# Patient Record
Sex: Male | Born: 1960
Health system: Southern US, Community
[De-identification: ages and names within clinical notes are randomized; demographics above are authoritative.]

## PROBLEM LIST (undated history)

## (undated) DIAGNOSIS — M1990 Unspecified osteoarthritis, unspecified site: Secondary | ICD-10-CM

## (undated) DIAGNOSIS — K219 Gastro-esophageal reflux disease without esophagitis: Secondary | ICD-10-CM

## (undated) DIAGNOSIS — K449 Diaphragmatic hernia without obstruction or gangrene: Secondary | ICD-10-CM

## (undated) DIAGNOSIS — I10 Essential (primary) hypertension: Secondary | ICD-10-CM

## (undated) DIAGNOSIS — H269 Unspecified cataract: Secondary | ICD-10-CM

## (undated) HISTORY — DX: Essential (primary) hypertension: I10

## (undated) HISTORY — DX: Diaphragmatic hernia without obstruction or gangrene: K44.9

## (undated) HISTORY — DX: Unspecified osteoarthritis, unspecified site: M19.90

## (undated) HISTORY — PX: OTHER SURGICAL HISTORY: SHX169

## (undated) HISTORY — DX: Unspecified cataract: H26.9

---
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
## 2004-10-27 ENCOUNTER — Ambulatory Visit: Payer: Self-pay | Admitting: Internal Medicine

## 2004-10-29 ENCOUNTER — Ambulatory Visit: Payer: Self-pay | Admitting: Unknown Physician Specialty

## 2004-11-01 ENCOUNTER — Ambulatory Visit: Payer: Self-pay | Admitting: Internal Medicine

## 2004-11-03 ENCOUNTER — Ambulatory Visit: Payer: Self-pay | Admitting: Internal Medicine

## 2005-09-05 ENCOUNTER — Ambulatory Visit: Payer: Self-pay | Admitting: Unknown Physician Specialty

## 2009-01-28 ENCOUNTER — Ambulatory Visit: Payer: Self-pay | Admitting: Internal Medicine

## 2009-02-18 ENCOUNTER — Ambulatory Visit: Payer: Self-pay | Admitting: Internal Medicine

## 2009-02-27 ENCOUNTER — Ambulatory Visit: Payer: Self-pay | Admitting: Internal Medicine

## 2009-03-30 ENCOUNTER — Ambulatory Visit: Payer: Self-pay | Admitting: Internal Medicine

## 2009-09-07 ENCOUNTER — Ambulatory Visit: Payer: Self-pay | Admitting: Internal Medicine

## 2013-11-04 LAB — LIPID PANEL
CHOLESTEROL: 176 mg/dL (ref 0–200)
HDL: 22 mg/dL — AB (ref 35–70)
LDL CALC: 94 mg/dL
Triglycerides: 301 mg/dL — AB (ref 40–160)

## 2013-11-04 LAB — TSH: TSH: 1.4 u[IU]/mL (ref <=5.90)

## 2013-12-03 ENCOUNTER — Ambulatory Visit: Payer: Self-pay | Admitting: Family Medicine

## 2014-07-17 ENCOUNTER — Encounter: Payer: Self-pay | Admitting: *Deleted

## 2014-07-18 ENCOUNTER — Ambulatory Visit (INDEPENDENT_AMBULATORY_CARE_PROVIDER_SITE_OTHER): Payer: BLUE CROSS/BLUE SHIELD | Admitting: Cardiovascular Disease

## 2014-07-18 ENCOUNTER — Encounter: Payer: Self-pay | Admitting: Cardiovascular Disease

## 2014-07-18 VITALS — BP 158/100 | HR 66 | Ht 73.0 in | Wt 234.8 lb

## 2014-07-18 DIAGNOSIS — R9431 Abnormal electrocardiogram [ECG] [EKG]: Secondary | ICD-10-CM

## 2014-07-18 DIAGNOSIS — Z01810 Encounter for preprocedural cardiovascular examination: Secondary | ICD-10-CM

## 2014-07-18 DIAGNOSIS — I10 Essential (primary) hypertension: Secondary | ICD-10-CM

## 2014-07-18 LAB — HEMOGLOBIN A1C: Hgb A1c MFr Bld: 6.1 % — AB (ref 4.0–6.0)

## 2014-07-18 LAB — BASIC METABOLIC PANEL
BUN: 13 mg/dL (ref 4–21)
CREATININE: 1 mg/dL (ref <=1.3)
GLUCOSE: 128 mg/dL
Sodium: 5 mmol/L — AB (ref 137–147)

## 2014-07-18 NOTE — Patient Instructions (Signed)
You are at low risk for hip surgery from a cardiac standpoint.   Follow up as needed.

## 2014-07-18 NOTE — Assessment & Plan Note (Signed)
The patient has no previous cardiac history and has no cardiac symptoms. Physical exam is unremarkable with no cardiac murmurs. Functional capacity is reduced due to hip arthritis but overall is reasonable. Recent abnormal ECG was likely due to lead misplacement in V3. ECG from today is normal and there is no evidence of prior infarct. Thus, the patient is considered at an overall low risk from a cardiac standpoint for planned hip surgery. No ischemic evaluation is indicated at the present time.

## 2014-07-18 NOTE — Assessment & Plan Note (Signed)
Blood pressure is elevated. He was recently started on small dose amlodipine. If blood pressure remains elevated, the dose can be gradually increased.

## 2014-07-18 NOTE — Progress Notes (Signed)
Primary care physician: Dr. Venia Minks  HPI  This is a 54 year old male who was referred for preoperative cardiovascular evaluation before hip surgery. He has no previous cardiac history. He has known history of hypertension and previous tobacco use. He has no history of diabetes, chronic kidney disease or hyperlipidemia. He quit smoking in 1986. He drinks 2-3 beers a day. He has no family history of coronary artery disease. He has severe osteoarthritis affecting his right hip and needs to have hip replacement which is scheduled for next month. He was found to have slightly abnormal EKG recently. He denies any chest pain, shortness of breath, palpitations, orthopnea or lower extremity edema. His functional capacity is reduced due to hip arthritis. However, he is able to perform activities of daily living without cardiac symptoms. He was recently started on amlodipine for blood pressure control.  Allergies  Allergen Reactions  . Bee Venom      Current Outpatient Prescriptions on File Prior to Visit  Medication Sig Dispense Refill  . amLODipine (NORVASC) 2.5 MG tablet Take 2.5 mg by mouth daily.     No current facility-administered medications on file prior to visit.     Past Medical History  Diagnosis Date  . Hypertension   . Hernia, hiatal      Past Surgical History  Procedure Laterality Date  . Hernia repair    . Bicep surgery    . Elbow surgery       Family History  Problem Relation Age of Onset  . Family history unknown: Yes     History   Social History  . Marital Status: Married    Spouse Name: N/A  . Number of Children: N/A  . Years of Education: N/A   Occupational History  . Not on file.   Social History Main Topics  . Smoking status: Former Smoker    Types: Cigarettes  . Smokeless tobacco: Not on file  . Alcohol Use: Yes     Comment: daily  . Drug Use: No  . Sexual Activity: Not on file   Other Topics Concern  . Not on file   Social History  Narrative     ROS A 10 point review of system was performed. It is negative other than that mentioned in the history of present illness.   PHYSICAL EXAM   BP 158/100 mmHg  Pulse 66  Ht 6\' 1"  (1.854 m)  Wt 234 lb 12 oz (106.482 kg)  BMI 30.98 kg/m2 Constitutional: He is oriented to person, place, and time. He appears well-developed and well-nourished. No distress.  HENT: No nasal discharge.  Head: Normocephalic and atraumatic.  Eyes: Pupils are equal and round.  No discharge. Neck: Normal range of motion. Neck supple. No JVD present. No thyromegaly present.  Cardiovascular: Normal rate, regular rhythm, normal heart sounds. Exam reveals no gallop and no friction rub. No murmur heard.  Pulmonary/Chest: Effort normal and breath sounds normal. No stridor. No respiratory distress. He has no wheezes. He has no rales. He exhibits no tenderness.  Abdominal: Soft. Bowel sounds are normal. He exhibits no distension. There is no tenderness. There is no rebound and no guarding.  Musculoskeletal: Normal range of motion. He exhibits no edema and no tenderness.  Neurological: He is alert and oriented to person, place, and time. Coordination normal.  Skin: Skin is warm and dry. No rash noted. He is not diaphoretic. No erythema. No pallor.  Psychiatric: He has a normal mood and affect. His behavior is normal. Judgment  and thought content normal.       TDD:UKGUR  Rhythm  WITHIN NORMAL LIMITS   ASSESSMENT AND PLAN

## 2014-07-24 ENCOUNTER — Ambulatory Visit: Payer: Self-pay | Admitting: Physician Assistant

## 2014-07-24 NOTE — H&P (Signed)
TOTAL HIP ADMISSION H&P  Patient is admitted for right total hip arthroplasty.  Subjective:  Chief Complaint: right hip pain  HPI: Chad Matthews, 54 y.o. male, has a history of pain and functional disability in the right hip(s) due to arthritis and patient has failed non-surgical conservative treatments for greater than 12 weeks to include NSAID's and/or analgesics, corticosteriod injections, use of assistive devices and activity modification.  Onset of symptoms was gradual starting 3 years ago with gradually worsening course since that time.The patient noted no past surgery on the right hip(s).  Patient currently rates pain in the right hip at 8 out of 10 with activity. Patient has night pain, worsening of pain with activity and weight bearing, trendelenberg gait, pain that interfers with activities of daily living, pain with passive range of motion, crepitus and joint swelling. Patient has evidence of periarticular osteophytes and joint space narrowing by imaging studies. This condition presents safety issues increasing the risk of falls.There is no current active infection.  Patient Active Problem List   Diagnosis Date Noted  . Pre-operative cardiovascular examination 07/18/2014  . Essential hypertension 07/18/2014   Past Medical History  Diagnosis Date  . Hypertension   . Hernia, hiatal     Past Surgical History  Procedure Laterality Date  . Hernia repair    . Bicep surgery    . Elbow surgery       (Not in a hospital admission) Allergies  Allergen Reactions  . Bee Venom Anaphylaxis    History  Substance Use Topics  . Smoking status: Former Smoker    Types: Cigarettes  . Smokeless tobacco: Not on file  . Alcohol Use: Yes     Comment: daily    Family History  Problem Relation Age of Onset  . Family history unknown: Yes     Review of Systems  Constitutional: Negative.   HENT: Positive for hearing loss and tinnitus. Negative for congestion, ear discharge, ear pain,  nosebleeds and sore throat.   Eyes: Negative.   Respiratory: Negative.  Negative for stridor.   Cardiovascular: Negative.   Gastrointestinal: Negative.   Genitourinary: Negative.   Musculoskeletal: Positive for back pain and joint pain. Negative for falls.  Skin: Negative.   Neurological: Negative.  Negative for headaches.  Endo/Heme/Allergies: Negative.   Psychiatric/Behavioral: Negative.     Objective:  Physical Exam  Constitutional: He is oriented to person, place, and time. He appears well-developed and well-nourished. No distress.  HENT:  Head: Normocephalic and atraumatic.  Nose: Nose normal.  Eyes: Conjunctivae and EOM are normal. Pupils are equal, round, and reactive to light.  Neck: Normal range of motion. Neck supple.  Cardiovascular: Normal rate, regular rhythm, normal heart sounds and intact distal pulses.   Respiratory: Effort normal and breath sounds normal. No respiratory distress. He has no wheezes.  GI: Soft. Bowel sounds are normal. He exhibits no distension. There is no tenderness.  Musculoskeletal:       Right hip: He exhibits decreased range of motion and bony tenderness.  Lymphadenopathy:    He has no cervical adenopathy.  Neurological: He is alert and oriented to person, place, and time. No cranial nerve deficit.  Skin: Skin is warm and dry. No rash noted. No erythema.  Psychiatric: He has a normal mood and affect. His behavior is normal.    Vital signs in last 24 hours: @VSRANGES @  Labs:   Estimated body mass index is 30.98 kg/(m^2) as calculated from the following:   Height as of  07/18/14: 6\' 1"  (1.854 m).   Weight as of 07/18/14: 106.482 kg (234 lb 12 oz).   Imaging Review Plain radiographs demonstrate severe degenerative joint disease of the right hip(s). The bone quality appears to be good for age and reported activity level.  Assessment/Plan:  End stage arthritis, right hip(s)  The patient history, physical examination, clinical judgement  of the provider and imaging studies are consistent with end stage degenerative joint disease of the right hip(s) and total hip arthroplasty is deemed medically necessary. The treatment options including medical management, injection therapy, arthroscopy and arthroplasty were discussed at length. The risks and benefits of total hip arthroplasty were presented and reviewed. The risks due to aseptic loosening, infection, stiffness, dislocation/subluxation,  thromboembolic complications and other imponderables were discussed.  The patient acknowledged the explanation, agreed to proceed with the plan and consent was signed. Patient is being admitted for inpatient treatment for surgery, pain control, PT, OT, prophylactic antibiotics, VTE prophylaxis, progressive ambulation and ADL's and discharge planning.The patient is planning to be discharged home with home health services

## 2014-07-25 ENCOUNTER — Encounter (HOSPITAL_COMMUNITY): Payer: Self-pay

## 2014-07-25 ENCOUNTER — Encounter (HOSPITAL_COMMUNITY)
Admission: RE | Admit: 2014-07-25 | Discharge: 2014-07-25 | Disposition: A | Discharge status: Home / Self Care | Payer: BLUE CROSS/BLUE SHIELD | Source: Ambulatory Visit | Attending: Physician Assistant | Admitting: Physician Assistant

## 2014-07-25 ENCOUNTER — Encounter (HOSPITAL_COMMUNITY)
Admission: RE | Admit: 2014-07-25 | Discharge: 2014-07-25 | Disposition: A | Discharge status: Home / Self Care | Payer: BLUE CROSS/BLUE SHIELD | Source: Ambulatory Visit | Attending: Orthopedic Surgery | Admitting: Orthopedic Surgery

## 2014-07-25 DIAGNOSIS — M1611 Unilateral primary osteoarthritis, right hip: Secondary | ICD-10-CM | POA: Diagnosis not present

## 2014-07-25 DIAGNOSIS — Z01812 Encounter for preprocedural laboratory examination: Secondary | ICD-10-CM | POA: Insufficient documentation

## 2014-07-25 HISTORY — DX: Gastro-esophageal reflux disease without esophagitis: K21.9

## 2014-07-25 LAB — URINALYSIS, ROUTINE W REFLEX MICROSCOPIC
Bilirubin Urine: NEGATIVE
Glucose, UA: NEGATIVE mg/dL
HGB URINE DIPSTICK: NEGATIVE
Ketones, ur: NEGATIVE mg/dL
Leukocytes, UA: NEGATIVE
NITRITE: NEGATIVE
PH: 5.5 (ref 5.0–8.0)
Protein, ur: NEGATIVE mg/dL
SPECIFIC GRAVITY, URINE: 1.019 (ref 1.005–1.030)
UROBILINOGEN UA: 0.2 mg/dL (ref 0.0–1.0)

## 2014-07-25 LAB — COMPREHENSIVE METABOLIC PANEL
ALK PHOS: 96 U/L (ref 39–117)
ALT: 22 U/L (ref 0–53)
ANION GAP: 9 (ref 5–15)
AST: 26 U/L (ref 0–37)
Albumin: 4.8 g/dL (ref 3.5–5.2)
BILIRUBIN TOTAL: 1.5 mg/dL — AB (ref 0.3–1.2)
BUN: 12 mg/dL (ref 6–23)
CHLORIDE: 101 mmol/L (ref 96–112)
CO2: 26 mmol/L (ref 19–32)
Calcium: 9.3 mg/dL (ref 8.4–10.5)
Creatinine, Ser: 0.91 mg/dL (ref 0.50–1.35)
GFR calc Af Amer: >90 mL/min (ref >90)
GFR calc non Af Amer: >90 mL/min (ref >90)
Glucose, Bld: 137 mg/dL — ABNORMAL HIGH (ref 70–99)
POTASSIUM: 3.5 mmol/L (ref 3.5–5.1)
SODIUM: 136 mmol/L (ref 135–145)
TOTAL PROTEIN: 7.8 g/dL (ref 6.0–8.3)

## 2014-07-25 LAB — CBC WITH DIFFERENTIAL/PLATELET
BASOS ABS: 0 10*3/uL (ref 0.0–0.1)
Basophils Relative: 0 % (ref 0–1)
EOS ABS: 0.1 10*3/uL (ref 0.0–0.7)
EOS PCT: 2 % (ref 0–5)
HEMATOCRIT: 44.9 % (ref 39.0–52.0)
Hemoglobin: 15.8 g/dL (ref 13.0–17.0)
Lymphocytes Relative: 22 % (ref 12–46)
Lymphs Abs: 1.2 10*3/uL (ref 0.7–4.0)
MCH: 33.8 pg (ref 26.0–34.0)
MCHC: 35.2 g/dL (ref 30.0–36.0)
MCV: 95.9 fL (ref 78.0–100.0)
MONO ABS: 0.4 10*3/uL (ref 0.1–1.0)
MONOS PCT: 8 % (ref 3–12)
Neutro Abs: 3.6 10*3/uL (ref 1.7–7.7)
Neutrophils Relative %: 67 % (ref 43–77)
Platelets: 108 10*3/uL — ABNORMAL LOW (ref 150–400)
RBC: 4.68 MIL/uL (ref 4.22–5.81)
RDW: 12.1 % (ref 11.5–15.5)
WBC: 5.3 10*3/uL (ref 4.0–10.5)

## 2014-07-25 LAB — PROTIME-INR
INR: 1.11 (ref 0.00–1.49)
Prothrombin Time: 14.4 seconds (ref 11.6–15.2)

## 2014-07-25 LAB — SURGICAL PCR SCREEN
MRSA, PCR: NEGATIVE
Staphylococcus aureus: POSITIVE — AB

## 2014-07-25 LAB — APTT: APTT: 29 s (ref 24–37)

## 2014-07-25 NOTE — Pre-Procedure Instructions (Signed)
Chad Matthews  [birth year]   Your procedure is scheduled on:  March 11th, Friday   Report to [facility] Admitting at 5:30 AM.   Call this number if you have problems the morning of surgery: [phone]   Remember:   Do not eat food or drink liquids after midnight Thursday.   Take these medicines the morning of surgery with A SIP OF WATER: Amlodipine   Do not wear jewelry - no rings or watches.  Do not wear lotions or colognes.  You may NOT wear deodorant the day of surgery.   Men may shave face and neck.   Do not bring valuables to the hospital.  [facility] is not responsible for any belongings or valuables.               Contacts, dentures or bridgework may not be worn into surgery.  Leave suitcase in the car. After surgery it may be brought to your room.  For patients admitted to the hospital, discharge time is determined by your treatment team.               Name and phone number of your driver:    Special Instructions: "Preparing for Surgery" instruction sheet.   Please read over the following fact sheets that you were given: Pain Booklet, Coughing and Deep Breathing, Blood Transfusion Information, MRSA Information and Surgical Site Infection Prevention

## 2014-07-26 LAB — URINE CULTURE
COLONY COUNT: NO GROWTH
Culture: NO GROWTH

## 2014-08-07 MED ORDER — CEFAZOLIN SODIUM-DEXTROSE 2-3 GM-% IV SOLR
2.0000 g | INTRAVENOUS | Status: AC
  Administered 2014-08-08: 2 g via INTRAVENOUS
  Filled 2014-08-07: qty 50

## 2014-08-07 MED ORDER — TRANEXAMIC ACID 100 MG/ML IV SOLN
1000.0000 mg | INTRAVENOUS | Status: AC
  Administered 2014-08-08: 1000 mg via INTRAVENOUS
  Filled 2014-08-07: qty 10

## 2014-08-08 ENCOUNTER — Inpatient Hospital Stay (HOSPITAL_COMMUNITY): Payer: BLUE CROSS/BLUE SHIELD

## 2014-08-08 ENCOUNTER — Encounter (HOSPITAL_COMMUNITY)
Admission: RE | Disposition: A | Discharge status: Home Health | Payer: Self-pay | Source: Ambulatory Visit | Attending: Orthopedic Surgery

## 2014-08-08 ENCOUNTER — Encounter (HOSPITAL_COMMUNITY): Payer: Self-pay | Admitting: *Deleted

## 2014-08-08 ENCOUNTER — Inpatient Hospital Stay (HOSPITAL_COMMUNITY): Payer: BLUE CROSS/BLUE SHIELD | Admitting: Certified Registered Nurse Anesthetist

## 2014-08-08 ENCOUNTER — Inpatient Hospital Stay (HOSPITAL_COMMUNITY)
Admission: RE | Admit: 2014-08-08 | Discharge: 2014-08-10 | DRG: 470 | Disposition: A | Discharge status: Home Health | Payer: BLUE CROSS/BLUE SHIELD | Source: Ambulatory Visit | Attending: Orthopedic Surgery | Admitting: Orthopedic Surgery

## 2014-08-08 DIAGNOSIS — D6942 Congenital and hereditary thrombocytopenia purpura: Secondary | ICD-10-CM | POA: Diagnosis present

## 2014-08-08 DIAGNOSIS — Z79899 Other long term (current) drug therapy: Secondary | ICD-10-CM | POA: Diagnosis not present

## 2014-08-08 DIAGNOSIS — Z7901 Long term (current) use of anticoagulants: Secondary | ICD-10-CM

## 2014-08-08 DIAGNOSIS — K219 Gastro-esophageal reflux disease without esophagitis: Secondary | ICD-10-CM | POA: Diagnosis present

## 2014-08-08 DIAGNOSIS — Z87891 Personal history of nicotine dependence: Secondary | ICD-10-CM

## 2014-08-08 DIAGNOSIS — Z96641 Presence of right artificial hip joint: Secondary | ICD-10-CM

## 2014-08-08 DIAGNOSIS — I10 Essential (primary) hypertension: Secondary | ICD-10-CM | POA: Diagnosis present

## 2014-08-08 DIAGNOSIS — M1611 Unilateral primary osteoarthritis, right hip: Secondary | ICD-10-CM | POA: Diagnosis present

## 2014-08-08 DIAGNOSIS — D62 Acute posthemorrhagic anemia: Secondary | ICD-10-CM | POA: Diagnosis not present

## 2014-08-08 DIAGNOSIS — Z419 Encounter for procedure for purposes other than remedying health state, unspecified: Secondary | ICD-10-CM

## 2014-08-08 HISTORY — PX: TOTAL HIP ARTHROPLASTY: SHX124

## 2014-08-08 LAB — CREATININE, SERUM
CREATININE: 0.93 mg/dL (ref 0.50–1.35)
GFR calc non Af Amer: >90 mL/min (ref >90)

## 2014-08-08 LAB — ABO/RH: ABO/RH(D): A NEG

## 2014-08-08 LAB — CBC
HCT: 38.8 % — ABNORMAL LOW (ref 39.0–52.0)
HEMOGLOBIN: 13.2 g/dL (ref 13.0–17.0)
MCH: 32.8 pg (ref 26.0–34.0)
MCHC: 34 g/dL (ref 30.0–36.0)
MCV: 96.5 fL (ref 78.0–100.0)
Platelets: 95 10*3/uL — ABNORMAL LOW (ref 150–400)
RBC: 4.02 MIL/uL — ABNORMAL LOW (ref 4.22–5.81)
RDW: 12.2 % (ref 11.5–15.5)
WBC: 10.8 10*3/uL — ABNORMAL HIGH (ref 4.0–10.5)

## 2014-08-08 LAB — TYPE AND SCREEN
ABO/RH(D): A NEG
Antibody Screen: NEGATIVE

## 2014-08-08 SURGERY — ARTHROPLASTY, HIP, TOTAL,POSTERIOR APPROACH
Anesthesia: General | Site: Hip | Laterality: Right

## 2014-08-08 MED ORDER — VECURONIUM BROMIDE 10 MG IV SOLR
INTRAVENOUS | Status: AC
Start: 2014-08-08 — End: 2014-08-08
  Filled 2014-08-08: qty 10

## 2014-08-08 MED ORDER — ROCURONIUM BROMIDE 50 MG/5ML IV SOLN
INTRAVENOUS | Status: AC
  Filled 2014-08-08: qty 1

## 2014-08-08 MED ORDER — DOCUSATE SODIUM 100 MG PO CAPS
100.0000 mg | ORAL_CAPSULE | Freq: Two times a day (BID) | ORAL | Status: DC
  Administered 2014-08-08 – 2014-08-10 (×4): 100 mg via ORAL
  Filled 2014-08-08 (×4): qty 1

## 2014-08-08 MED ORDER — LIDOCAINE HCL (CARDIAC) 20 MG/ML IV SOLN
INTRAVENOUS | Status: AC
  Filled 2014-08-08: qty 15

## 2014-08-08 MED ORDER — LABETALOL HCL 5 MG/ML IV SOLN
INTRAVENOUS | Status: AC
  Filled 2014-08-08: qty 4

## 2014-08-08 MED ORDER — PROMETHAZINE HCL 25 MG/ML IJ SOLN
6.2500 mg | INTRAMUSCULAR | Status: DC | PRN
Start: 2014-08-08 — End: 2014-08-08

## 2014-08-08 MED ORDER — MIDAZOLAM HCL 2 MG/2ML IJ SOLN
INTRAMUSCULAR | Status: AC
  Filled 2014-08-08: qty 2

## 2014-08-08 MED ORDER — VECURONIUM BROMIDE 10 MG IV SOLR
INTRAVENOUS | Status: DC | PRN
  Administered 2014-08-08 (×2): 1 mg via INTRAVENOUS

## 2014-08-08 MED ORDER — ONDANSETRON HCL 4 MG/2ML IJ SOLN: 4.0000 mg | Freq: Four times a day (QID) | INTRAMUSCULAR | Status: DC | PRN

## 2014-08-08 MED ORDER — METHOCARBAMOL 500 MG PO TABS
500.0000 mg | ORAL_TABLET | Freq: Four times a day (QID) | ORAL | Status: DC | PRN
  Administered 2014-08-08 – 2014-08-09 (×3): 500 mg via ORAL
  Filled 2014-08-08 (×2): qty 1

## 2014-08-08 MED ORDER — LIDOCAINE HCL (CARDIAC) 20 MG/ML IV SOLN
INTRAVENOUS | Status: DC | PRN
  Administered 2014-08-08: 100 mg via INTRAVENOUS

## 2014-08-08 MED ORDER — DIPHENHYDRAMINE HCL 12.5 MG/5ML PO ELIX: 12.5000 mg | ORAL_SOLUTION | ORAL | Status: DC | PRN

## 2014-08-08 MED ORDER — PROPOFOL 10 MG/ML IV BOLUS
INTRAVENOUS | Status: DC | PRN
  Administered 2014-08-08: 150 mg via INTRAVENOUS
  Administered 2014-08-08: 20 mg via INTRAVENOUS

## 2014-08-08 MED ORDER — BISACODYL 10 MG RE SUPP: 10.0000 mg | Freq: Every day | RECTAL | Status: DC | PRN

## 2014-08-08 MED ORDER — NEOSTIGMINE METHYLSULFATE 10 MG/10ML IV SOLN
INTRAVENOUS | Status: AC
  Filled 2014-08-08: qty 4

## 2014-08-08 MED ORDER — PHENYLEPHRINE 40 MCG/ML (10ML) SYRINGE FOR IV PUSH (FOR BLOOD PRESSURE SUPPORT)
PREFILLED_SYRINGE | INTRAVENOUS | Status: AC
  Filled 2014-08-08: qty 10

## 2014-08-08 MED ORDER — ALUM & MAG HYDROXIDE-SIMETH 200-200-20 MG/5ML PO SUSP: 30.0000 mL | ORAL | Status: DC | PRN

## 2014-08-08 MED ORDER — SENNOSIDES-DOCUSATE SODIUM 8.6-50 MG PO TABS: 1.0000 | ORAL_TABLET | Freq: Every evening | ORAL | Status: DC | PRN

## 2014-08-08 MED ORDER — METOCLOPRAMIDE HCL 10 MG PO TABS: 5.0000 mg | ORAL_TABLET | Freq: Three times a day (TID) | ORAL | Status: DC | PRN

## 2014-08-08 MED ORDER — LACTATED RINGERS IV SOLN
INTRAVENOUS | Status: DC | PRN
  Administered 2014-08-08 (×2): via INTRAVENOUS

## 2014-08-08 MED ORDER — SODIUM CHLORIDE 0.9 % IV SOLN: INTRAVENOUS | Status: DC

## 2014-08-08 MED ORDER — MENTHOL 3 MG MT LOZG
1.0000 | LOZENGE | OROMUCOSAL | Status: DC | PRN
Start: 2014-08-08 — End: 2014-08-10

## 2014-08-08 MED ORDER — METHOCARBAMOL 1000 MG/10ML IJ SOLN
500.0000 mg | Freq: Four times a day (QID) | INTRAVENOUS | Status: DC | PRN
  Filled 2014-08-08: qty 5

## 2014-08-08 MED ORDER — ONDANSETRON HCL 4 MG/2ML IJ SOLN
INTRAMUSCULAR | Status: AC
  Filled 2014-08-08: qty 2

## 2014-08-08 MED ORDER — GLYCOPYRROLATE 0.2 MG/ML IJ SOLN
INTRAMUSCULAR | Status: AC
  Filled 2014-08-08: qty 2

## 2014-08-08 MED ORDER — SODIUM CHLORIDE 0.9 % IV SOLN
INTRAVENOUS | Status: DC
  Administered 2014-08-09: 04:00:00 via INTRAVENOUS

## 2014-08-08 MED ORDER — EPHEDRINE SULFATE 50 MG/ML IJ SOLN
INTRAMUSCULAR | Status: AC
  Filled 2014-08-08: qty 1

## 2014-08-08 MED ORDER — STERILE WATER FOR INJECTION IJ SOLN
INTRAMUSCULAR | Status: AC
  Filled 2014-08-08: qty 10

## 2014-08-08 MED ORDER — OXYCODONE HCL 5 MG PO TABS: 5.0000 mg | ORAL_TABLET | Freq: Once | ORAL | Status: DC | PRN

## 2014-08-08 MED ORDER — 0.9 % SODIUM CHLORIDE (POUR BTL) OPTIME
TOPICAL | Status: DC | PRN
  Administered 2014-08-08: 1000 mL

## 2014-08-08 MED ORDER — METHOCARBAMOL 500 MG PO TABS
ORAL_TABLET | ORAL | Status: AC
  Filled 2014-08-08: qty 1

## 2014-08-08 MED ORDER — METOCLOPRAMIDE HCL 5 MG/ML IJ SOLN: 5.0000 mg | Freq: Three times a day (TID) | INTRAMUSCULAR | Status: DC | PRN

## 2014-08-08 MED ORDER — PHENYLEPHRINE HCL 10 MG/ML IJ SOLN
INTRAMUSCULAR | Status: DC | PRN
  Administered 2014-08-08: 40 ug via INTRAVENOUS
  Administered 2014-08-08: 80 ug via INTRAVENOUS

## 2014-08-08 MED ORDER — ACETAMINOPHEN 650 MG RE SUPP: 650.0000 mg | Freq: Four times a day (QID) | RECTAL | Status: DC | PRN

## 2014-08-08 MED ORDER — ENOXAPARIN SODIUM 30 MG/0.3ML ~~LOC~~ SOLN: 30.0000 mg | Freq: Two times a day (BID) | SUBCUTANEOUS | Status: DC

## 2014-08-08 MED ORDER — OXYCODONE HCL 5 MG/5ML PO SOLN: 5.0000 mg | Freq: Once | ORAL | Status: DC | PRN

## 2014-08-08 MED ORDER — AMLODIPINE BESYLATE 2.5 MG PO TABS
2.5000 mg | ORAL_TABLET | Freq: Every day | ORAL | Status: DC
  Administered 2014-08-09 – 2014-08-10 (×2): 2.5 mg via ORAL
  Filled 2014-08-08 (×2): qty 1

## 2014-08-08 MED ORDER — DEXMEDETOMIDINE HCL IN NACL 200 MCG/50ML IV SOLN
INTRAVENOUS | Status: AC
Start: 2014-08-08 — End: 2014-08-08
  Filled 2014-08-08: qty 50

## 2014-08-08 MED ORDER — FENTANYL CITRATE 0.05 MG/ML IJ SOLN
INTRAMUSCULAR | Status: AC
  Filled 2014-08-08: qty 5

## 2014-08-08 MED ORDER — OXYCODONE HCL 5 MG PO TABS
5.0000 mg | ORAL_TABLET | ORAL | Status: DC | PRN
  Administered 2014-08-08 – 2014-08-09 (×4): 10 mg via ORAL
  Administered 2014-08-09 (×2): 5 mg via ORAL
  Administered 2014-08-09 – 2014-08-10 (×2): 10 mg via ORAL
  Filled 2014-08-08: qty 1
  Filled 2014-08-08 (×6): qty 2
  Filled 2014-08-08: qty 1

## 2014-08-08 MED ORDER — ROCURONIUM BROMIDE 100 MG/10ML IV SOLN
INTRAVENOUS | Status: DC | PRN
  Administered 2014-08-08: 40 mg via INTRAVENOUS
  Administered 2014-08-08: 10 mg via INTRAVENOUS

## 2014-08-08 MED ORDER — MIDAZOLAM HCL 5 MG/5ML IJ SOLN
INTRAMUSCULAR | Status: DC | PRN
  Administered 2014-08-08: 2 mg via INTRAVENOUS

## 2014-08-08 MED ORDER — HYDROMORPHONE HCL 1 MG/ML IJ SOLN
INTRAMUSCULAR | Status: AC
  Filled 2014-08-08: qty 1

## 2014-08-08 MED ORDER — NEOSTIGMINE METHYLSULFATE 10 MG/10ML IV SOLN
INTRAVENOUS | Status: DC | PRN
  Administered 2014-08-08: 3 mg via INTRAVENOUS

## 2014-08-08 MED ORDER — FENTANYL CITRATE 0.05 MG/ML IJ SOLN
INTRAMUSCULAR | Status: DC | PRN
  Administered 2014-08-08 (×2): 50 ug via INTRAVENOUS
  Administered 2014-08-08: 100 ug via INTRAVENOUS
  Administered 2014-08-08: 50 ug via INTRAVENOUS
  Administered 2014-08-08: 100 ug via INTRAVENOUS
  Administered 2014-08-08: 50 ug via INTRAVENOUS

## 2014-08-08 MED ORDER — BUPIVACAINE-EPINEPHRINE 0.5% -1:200000 IJ SOLN
INTRAMUSCULAR | Status: DC | PRN
  Administered 2014-08-08: 30 mL

## 2014-08-08 MED ORDER — PROPOFOL 10 MG/ML IV BOLUS
INTRAVENOUS | Status: AC
  Filled 2014-08-08: qty 20

## 2014-08-08 MED ORDER — CHLORHEXIDINE GLUCONATE 4 % EX LIQD
60.0000 mL | Freq: Once | CUTANEOUS | Status: DC
  Filled 2014-08-08: qty 60

## 2014-08-08 MED ORDER — ONDANSETRON HCL 4 MG/2ML IJ SOLN
INTRAMUSCULAR | Status: DC | PRN
  Administered 2014-08-08: 4 mg via INTRAVENOUS

## 2014-08-08 MED ORDER — ENOXAPARIN SODIUM 30 MG/0.3ML ~~LOC~~ SOLN
30.0000 mg | Freq: Two times a day (BID) | SUBCUTANEOUS | Status: DC
  Administered 2014-08-09 – 2014-08-10 (×3): 30 mg via SUBCUTANEOUS
  Filled 2014-08-08 (×3): qty 0.3

## 2014-08-08 MED ORDER — DEXAMETHASONE SODIUM PHOSPHATE 10 MG/ML IJ SOLN
10.0000 mg | Freq: Once | INTRAMUSCULAR | Status: AC
  Administered 2014-08-09: 10 mg via INTRAVENOUS
  Filled 2014-08-08: qty 1

## 2014-08-08 MED ORDER — CEFAZOLIN SODIUM-DEXTROSE 2-3 GM-% IV SOLR
2.0000 g | Freq: Four times a day (QID) | INTRAVENOUS | Status: AC
  Administered 2014-08-08 (×2): 2 g via INTRAVENOUS
  Filled 2014-08-08 (×2): qty 50

## 2014-08-08 MED ORDER — FAMOTIDINE 20 MG PO TABS
20.0000 mg | ORAL_TABLET | Freq: Every day | ORAL | Status: DC
  Administered 2014-08-08 – 2014-08-09 (×2): 20 mg via ORAL
  Filled 2014-08-08 (×2): qty 1

## 2014-08-08 MED ORDER — ONDANSETRON HCL 4 MG PO TABS: 4.0000 mg | ORAL_TABLET | Freq: Four times a day (QID) | ORAL | Status: DC | PRN

## 2014-08-08 MED ORDER — ACETAMINOPHEN 325 MG PO TABS: 650.0000 mg | ORAL_TABLET | Freq: Four times a day (QID) | ORAL | Status: DC | PRN

## 2014-08-08 MED ORDER — HYDROMORPHONE HCL 1 MG/ML IJ SOLN
0.2500 mg | INTRAMUSCULAR | Status: DC | PRN
  Administered 2014-08-08 (×2): 0.5 mg via INTRAVENOUS

## 2014-08-08 MED ORDER — SUCCINYLCHOLINE CHLORIDE 20 MG/ML IJ SOLN
INTRAMUSCULAR | Status: AC
  Filled 2014-08-08: qty 1

## 2014-08-08 MED ORDER — GLYCOPYRROLATE 0.2 MG/ML IJ SOLN
INTRAMUSCULAR | Status: DC | PRN
  Administered 2014-08-08: 0.4 mg via INTRAVENOUS

## 2014-08-08 MED ORDER — HYDROMORPHONE HCL 1 MG/ML IJ SOLN
1.0000 mg | INTRAMUSCULAR | Status: DC | PRN
  Administered 2014-08-08: 1 mg via INTRAVENOUS
  Filled 2014-08-08: qty 1

## 2014-08-08 MED ORDER — EPHEDRINE SULFATE 50 MG/ML IJ SOLN
INTRAMUSCULAR | Status: DC | PRN
  Administered 2014-08-08: 10 mg via INTRAVENOUS
  Administered 2014-08-08 (×2): 5 mg via INTRAVENOUS

## 2014-08-08 MED ORDER — METHOCARBAMOL 500 MG PO TABS: 500.0000 mg | ORAL_TABLET | Freq: Four times a day (QID) | ORAL | Status: DC

## 2014-08-08 MED ORDER — OXYCODONE HCL 5 MG PO TABS: 5.0000 mg | ORAL_TABLET | ORAL | Status: DC | PRN

## 2014-08-08 MED ORDER — PHENOL 1.4 % MT LIQD: 1.0000 | OROMUCOSAL | Status: DC | PRN

## 2014-08-08 MED ORDER — FLEET ENEMA 7-19 GM/118ML RE ENEM: 1.0000 | ENEMA | Freq: Once | RECTAL | Status: AC | PRN

## 2014-08-08 SURGICAL SUPPLY — 64 items
BLADE SAW SAG 73X25 THK (BLADE) ×2
BLADE SAW SGTL 73X25 THK (BLADE) ×1 IMPLANT
BRUSH FEMORAL CANAL (MISCELLANEOUS) IMPLANT
CAPT HIP TOTAL 2 ×3 IMPLANT
COVER SURGICAL LIGHT HANDLE (MISCELLANEOUS) ×3 IMPLANT
DRAPE IMP U-DRAPE 54X76 (DRAPES) ×3 IMPLANT
DRAPE INCISE IOBAN 66X45 STRL (DRAPES) ×3 IMPLANT
DRAPE ORTHO SPLIT 77X108 STRL (DRAPES) ×4
DRAPE SURG ORHT 6 SPLT 77X108 (DRAPES) ×2 IMPLANT
DRAPE U-SHAPE 47X51 STRL (DRAPES) ×3 IMPLANT
DRSG ADAPTIC 3X8 NADH LF (GAUZE/BANDAGES/DRESSINGS) ×3 IMPLANT
DRSG PAD ABDOMINAL 8X10 ST (GAUZE/BANDAGES/DRESSINGS) ×3 IMPLANT
DURAPREP 26ML APPLICATOR (WOUND CARE) ×3 IMPLANT
ELECT BLADE 6.5 EXT (BLADE) IMPLANT
ELECT CAUTERY BLADE 6.4 (BLADE) ×3 IMPLANT
ELECT REM PT RETURN 9FT ADLT (ELECTROSURGICAL) ×3
ELECTRODE REM PT RTRN 9FT ADLT (ELECTROSURGICAL) ×1 IMPLANT
EVACUATOR 1/8 PVC DRAIN (DRAIN) IMPLANT
FACESHIELD WRAPAROUND (MASK) ×9 IMPLANT
GAUZE SPONGE 4X4 12PLY STRL (GAUZE/BANDAGES/DRESSINGS) ×3 IMPLANT
GLOVE BIO SURGEON STRL SZ 6.5 (GLOVE) ×2 IMPLANT
GLOVE BIO SURGEON STRL SZ7.5 (GLOVE) ×9 IMPLANT
GLOVE BIO SURGEONS STRL SZ 6.5 (GLOVE) ×1
GLOVE BIOGEL PI IND STRL 6.5 (GLOVE) ×1 IMPLANT
GLOVE BIOGEL PI IND STRL 8 (GLOVE) ×2 IMPLANT
GLOVE BIOGEL PI INDICATOR 6.5 (GLOVE) ×2
GLOVE BIOGEL PI INDICATOR 8 (GLOVE) ×4
GLOVE ORTHO TXT STRL SZ7.5 (GLOVE) ×6 IMPLANT
GLOVE SURG ORTHO 8.0 STRL STRW (GLOVE) ×6 IMPLANT
GOWN STRL REUS W/ TWL LRG LVL3 (GOWN DISPOSABLE) ×3 IMPLANT
GOWN STRL REUS W/ TWL XL LVL3 (GOWN DISPOSABLE) ×1 IMPLANT
GOWN STRL REUS W/TWL 2XL LVL3 (GOWN DISPOSABLE) ×3 IMPLANT
GOWN STRL REUS W/TWL LRG LVL3 (GOWN DISPOSABLE) ×6
GOWN STRL REUS W/TWL XL LVL3 (GOWN DISPOSABLE) ×2
HANDPIECE INTERPULSE COAX TIP (DISPOSABLE)
HOOD PEEL AWAY FACE SHEILD DIS (HOOD) ×6 IMPLANT
IMMOBILIZER KNEE 22 (SOFTGOODS) ×3 IMPLANT
IMMOBILIZER KNEE 22 UNIV (SOFTGOODS) IMPLANT
KIT BASIN OR (CUSTOM PROCEDURE TRAY) ×3 IMPLANT
KIT ROOM TURNOVER OR (KITS) ×3 IMPLANT
MANIFOLD NEPTUNE II (INSTRUMENTS) ×3 IMPLANT
NEEDLE 22X1 1/2 (OR ONLY) (NEEDLE) ×3 IMPLANT
NEEDLE MAYO TROCAR (NEEDLE) ×3 IMPLANT
NS IRRIG 1000ML POUR BTL (IV SOLUTION) ×3 IMPLANT
PACK TOTAL JOINT (CUSTOM PROCEDURE TRAY) ×3 IMPLANT
PACK UNIVERSAL I (CUSTOM PROCEDURE TRAY) ×3 IMPLANT
PAD ARMBOARD 7.5X6 YLW CONV (MISCELLANEOUS) ×6 IMPLANT
PRESSURIZER FEMORAL UNIV (MISCELLANEOUS) IMPLANT
SET HNDPC FAN SPRY TIP SCT (DISPOSABLE) IMPLANT
STAPLER VISISTAT 35W (STAPLE) ×3 IMPLANT
SUCTION FRAZIER TIP 10 FR DISP (SUCTIONS) ×3 IMPLANT
SUT ETHIBOND NAB CT1 #1 30IN (SUTURE) ×6 IMPLANT
SUT VIC AB 0 CT1 27 (SUTURE) ×4
SUT VIC AB 0 CT1 27XBRD ANBCTR (SUTURE) ×2 IMPLANT
SUT VIC AB 1 CTB1 27 (SUTURE) ×6 IMPLANT
SUT VIC AB 2-0 CT1 27 (SUTURE) ×4
SUT VIC AB 2-0 CT1 TAPERPNT 27 (SUTURE) ×2 IMPLANT
SYR CONTROL 10ML LL (SYRINGE) ×3 IMPLANT
TAPE CLOTH SURG 4X10 WHT LF (GAUZE/BANDAGES/DRESSINGS) ×3 IMPLANT
TOWEL OR 17X24 6PK STRL BLUE (TOWEL DISPOSABLE) ×3 IMPLANT
TOWEL OR 17X26 10 PK STRL BLUE (TOWEL DISPOSABLE) ×3 IMPLANT
TOWER CARTRIDGE SMART MIX (DISPOSABLE) IMPLANT
TRAY FOLEY CATH 16FRSI W/METER (SET/KITS/TRAYS/PACK) IMPLANT
WATER STERILE IRR 1000ML POUR (IV SOLUTION) ×3 IMPLANT

## 2014-08-08 NOTE — Interval H&P Note (Signed)
History and Physical Interval Note:  08/08/2014 7:25 AM  Chad Matthews  has presented today for surgery, with the diagnosis of OA RIGHT HIP  The various methods of treatment have been discussed with the patient and family. After consideration of risks, benefits and other options for treatment, the patient has consented to  Procedure(s): RIGHT TOTAL HIP ARTHROPLASTY (Right) as a surgical intervention .  The patient's history has been reviewed, patient examined, no change in status, stable for surgery.  I have reviewed the patient's chart and labs.  Questions were answered to the patient's satisfaction.     Walter Min JR,W D

## 2014-08-08 NOTE — Evaluation (Signed)
Physical Therapy Evaluation Patient Details Name: Chad Matthews MRN: [id number] DOB: 01-19-1961 Today's Date: 08/08/2014   History of Present Illness  Patient is a 54 yo male admitted 08/08/14 now s/p Rt THA with posterior precautions.  PMH:  HTN, HOH, back pain, arthritis  Clinical Impression  Patient presents with problems listed below.  Will benefit from acute PT to maximize independence prior to discharge home with wife.  Should progress well with PT.    Follow Up Recommendations Home health PT;Supervision/Assistance - 24 hour    Equipment Recommendations  Rolling walker with 5" wheels;3in1 (PT)    Recommendations for Other Services       Precautions / Restrictions Precautions Precautions: Posterior Hip Precaution Booklet Issued: Yes (comment) Precaution Comments: Reviewed precautions with patient and wife Restrictions Weight Bearing Restrictions: Yes RLE Weight Bearing: Weight bearing as tolerated      Mobility  Bed Mobility Overal bed mobility: Needs Assistance Bed Mobility: Supine to Sit     Supine to sit: Min assist     General bed mobility comments: Verbal cues for technique to maintain hip precautions.  Assist to move RLE off of bed.  Patient able to use UE's to move trunk to sitting, and to scoot to EOB.    Transfers Overall transfer level: Needs assistance Equipment used: Rolling walker (2 wheeled) Transfers: Sit to/from Stand Sit to Stand: Min assist         General transfer comment: Verbal cues for hand placement and technique.  Assist to rise to standing and for balance initially.  Ambulation/Gait Ambulation/Gait assistance: Min assist Ambulation Distance (Feet): 30 Feet Assistive device: Rolling walker (2 wheeled) Gait Pattern/deviations: Step-to pattern;Decreased stance time - right;Decreased step length - left;Decreased stride length;Decreased weight shift to right;Antalgic Gait velocity: Decreased Gait velocity interpretation: Below  normal speed for age/gender General Gait Details: Verbal cues for safe use of RW and gait sequence.  Assist for balance/safety.  Patient ambulated to bathroom to void, and then ambulated to chair.  Stairs            Wheelchair Mobility    Modified Rankin (Stroke Patients Only)       Balance                                             Pertinent Vitals/Pain Pain Assessment: 0-10 Pain Score: 8  Pain Location: Rt hip Pain Descriptors / Indicators: Aching;Sore Pain Intervention(s): Limited activity within patient's tolerance;Repositioned;Patient requesting pain meds-RN notified    Home Living Family/patient expects to be discharged to:: Private residence Living Arrangements: Spouse/significant other Available Help at Discharge: Family;Available 24 hours/day Type of Home: House Home Access: Stairs to enter Entrance Stairs-Rails: Psychiatric nurse of Steps: 4 Home Layout: One level Home Equipment: None      Prior Function Level of Independence: Independent               Hand Dominance        Extremity/Trunk Assessment   Upper Extremity Assessment: Overall WFL for tasks assessed           Lower Extremity Assessment: RLE deficits/detail RLE Deficits / Details: Decreased strength/ROM post-op    Cervical / Trunk Assessment: Normal  Communication   Communication: HOH  Cognition Arousal/Alertness: Awake/alert Behavior During Therapy: WFL for tasks assessed/performed Overall Cognitive Status: Within Functional Limits for tasks assessed  General Comments      Exercises Total Joint Exercises Ankle Circles/Pumps: AROM;Both;10 reps;Supine      Assessment/Plan    PT Assessment Patient needs continued PT services  PT Diagnosis Difficulty walking;Acute pain   PT Problem List Decreased strength;Decreased range of motion;Decreased activity tolerance;Decreased balance;Decreased  mobility;Decreased knowledge of use of DME;Decreased knowledge of precautions;Pain  PT Treatment Interventions DME instruction;Gait training;Stair training;Functional mobility training;Therapeutic activities;Therapeutic exercise;Patient/family education   PT Goals (Current goals can be found in the Care Plan section) Acute Rehab PT Goals Patient Stated Goal: To go home PT Goal Formulation: With patient/family Time For Goal Achievement: 08/15/14 Potential to Achieve Goals: Good    Frequency 7X/week   Barriers to discharge        Co-evaluation               End of Session Equipment Utilized During Treatment: Gait belt Activity Tolerance: Patient tolerated treatment well;Patient limited by pain Patient left: in chair;with call bell/phone within reach;with family/visitor present;with nursing/sitter in room Nurse Communication: Mobility status;Patient requests pain meds         Time: 0488-8916 PT Time Calculation (min) (ACUTE ONLY): 23 min   Charges:   PT Evaluation $Initial PT Evaluation Tier I: 1 Procedure PT Treatments $Gait Training: 8-22 mins   PT G Codes:        Despina Pole 08-26-2014, 5:08 PM Carita Pian. Sanjuana Kava, [place] Pager [phone]

## 2014-08-08 NOTE — Brief Op Note (Signed)
08/08/2014  10:10 AM  PATIENT:  Chad Matthews  54 y.o. male  PRE-OPERATIVE DIAGNOSIS:  OA RIGHT HIP  POST-OPERATIVE DIAGNOSIS:  OA RIGHT HIP  PROCEDURE:  Procedure(s): RIGHT TOTAL HIP ARTHROPLASTY (Right)  SURGEON:  Surgeon(s) and Role:    * Earlie Server, MD - Primary  PHYSICIAN ASSISTANT: Chriss Czar, PA-C  ASSISTANTS:   ANESTHESIA:   local and general  EBL:  Total I/O In: 1400 [I.V.:1400] Out: 750 [Blood:750]  BLOOD ADMINISTERED:none  DRAINS: none   LOCAL MEDICATIONS USED:  MARCAINE     SPECIMEN:  No Specimen  DISPOSITION OF SPECIMEN:  N/A  COUNTS:  YES  TOURNIQUET:  * No tourniquets in log *  DICTATION: .Other Dictation: Dictation Number unknown  PLAN OF CARE: Admit to inpatient   PATIENT DISPOSITION:  PACU - hemodynamically stable.   Delay start of Pharmacological VTE agent (>24hrs) due to surgical blood loss or risk of bleeding: yes

## 2014-08-08 NOTE — Progress Notes (Signed)
Utilization Review Completed.Donne Anon T3/03/2015

## 2014-08-08 NOTE — Anesthesia Postprocedure Evaluation (Signed)
  Anesthesia Post-op Note  Patient: Chad Matthews  Procedure(s) Performed: Procedure(s): RIGHT TOTAL HIP ARTHROPLASTY (Right)  Patient Location: PACU  Anesthesia Type:General  Level of Consciousness: awake and alert   Airway and Oxygen Therapy: Patient Spontanous Breathing  Post-op Pain: mild  Post-op Assessment: Post-op Vital signs reviewed  Post-op Vital Signs: Reviewed  Last Vitals:  Filed Vitals:   08/08/14 1056  BP:   Pulse: 61  Temp: 36.9 C  Resp: 12    Complications: No apparent anesthesia complications

## 2014-08-08 NOTE — Transfer of Care (Signed)
Immediate Anesthesia Transfer of Care Note  Patient: Chad Matthews  Procedure(s) Performed: Procedure(s): RIGHT TOTAL HIP ARTHROPLASTY (Right)  Patient Location: PACU  Anesthesia Type:General  Level of Consciousness: awake, alert  and oriented  Airway & Oxygen Therapy: Patient Spontanous Breathing and Patient connected to nasal cannula oxygen  Post-op Assessment: Report given to RN, Post -op Vital signs reviewed and stable and Patient moving all extremities X 4  Post vital signs: Reviewed and stable  Last Vitals:  Filed Vitals:   08/08/14 0608  BP: 176/105  Pulse: 81  Temp: 36.8 C  Resp: 16    Complications: No apparent anesthesia complications

## 2014-08-08 NOTE — Anesthesia Procedure Notes (Signed)
Procedure Name: Intubation Date/Time: 08/08/2014 7:38 AM Performed by: Garrison Columbus T Pre-anesthesia Checklist: Patient identified, Emergency Drugs available, Suction available and Patient being monitored Patient Re-evaluated:Patient Re-evaluated prior to inductionOxygen Delivery Method: Circle system utilized Preoxygenation: Pre-oxygenation with 100% oxygen Intubation Type: IV induction Ventilation: Mask ventilation without difficulty and Oral airway inserted - appropriate to patient size Laryngoscope Size: Sabra Heck and 2 Grade View: Grade I Tube type: Oral Tube size: 7.5 mm Number of attempts: 1 Airway Equipment and Method: Stylet and Oral airway Placement Confirmation: ETT inserted through vocal cords under direct vision,  positive ETCO2 and breath sounds checked- equal and bilateral Secured at: 22 cm Tube secured with: Tape Dental Injury: Teeth and Oropharynx as per pre-operative assessment

## 2014-08-08 NOTE — H&P (View-Only) (Signed)
Primary care physician: Dr. Venia Minks  HPI  This is a 54 year old male who was referred for preoperative cardiovascular evaluation before hip surgery. He has no previous cardiac history. He has known history of hypertension and previous tobacco use. He has no history of diabetes, chronic kidney disease or hyperlipidemia. He quit smoking in 1986. He drinks 2-3 beers a day. He has no family history of coronary artery disease. He has severe osteoarthritis affecting his right hip and needs to have hip replacement which is scheduled for next month. He was found to have slightly abnormal EKG recently. He denies any chest pain, shortness of breath, palpitations, orthopnea or lower extremity edema. His functional capacity is reduced due to hip arthritis. However, he is able to perform activities of daily living without cardiac symptoms. He was recently started on amlodipine for blood pressure control.  Allergies  Allergen Reactions  . Bee Venom      Current Outpatient Prescriptions on File Prior to Visit  Medication Sig Dispense Refill  . amLODipine (NORVASC) 2.5 MG tablet Take 2.5 mg by mouth daily.     No current facility-administered medications on file prior to visit.     Past Medical History  Diagnosis Date  . Hypertension   . Hernia, hiatal      Past Surgical History  Procedure Laterality Date  . Hernia repair    . Bicep surgery    . Elbow surgery       Family History  Problem Relation Age of Onset  . Family history unknown: Yes     History   Social History  . Marital Status: Married    Spouse Name: N/A  . Number of Children: N/A  . Years of Education: N/A   Occupational History  . Not on file.   Social History Main Topics  . Smoking status: Former Smoker    Types: Cigarettes  . Smokeless tobacco: Not on file  . Alcohol Use: Yes     Comment: daily  . Drug Use: No  . Sexual Activity: Not on file   Other Topics Concern  . Not on file   Social History  Narrative     ROS A 10 point review of system was performed. It is negative other than that mentioned in the history of present illness.   PHYSICAL EXAM   BP 158/100 mmHg  Pulse 66  Ht 6\' 1"  (1.854 m)  Wt 234 lb 12 oz (106.482 kg)  BMI 30.98 kg/m2 Constitutional: He is oriented to person, place, and time. He appears well-developed and well-nourished. No distress.  HENT: No nasal discharge.  Head: Normocephalic and atraumatic.  Eyes: Pupils are equal and round.  No discharge. Neck: Normal range of motion. Neck supple. No JVD present. No thyromegaly present.  Cardiovascular: Normal rate, regular rhythm, normal heart sounds. Exam reveals no gallop and no friction rub. No murmur heard.  Pulmonary/Chest: Effort normal and breath sounds normal. No stridor. No respiratory distress. He has no wheezes. He has no rales. He exhibits no tenderness.  Abdominal: Soft. Bowel sounds are normal. He exhibits no distension. There is no tenderness. There is no rebound and no guarding.  Musculoskeletal: Normal range of motion. He exhibits no edema and no tenderness.  Neurological: He is alert and oriented to person, place, and time. Coordination normal.  Skin: Skin is warm and dry. No rash noted. He is not diaphoretic. No erythema. No pallor.  Psychiatric: He has a normal mood and affect. His behavior is normal. Judgment  and thought content normal.       FBP:ZWCHE  Rhythm  WITHIN NORMAL LIMITS   ASSESSMENT AND PLAN

## 2014-08-08 NOTE — Discharge Instructions (Signed)
Diet: As you were doing prior to hospitalization   Activity: Increase activity slowly as tolerated  No lifting or driving for 6 weeks   Shower: May shower without a dressing on post op day #5, NO SOAKING in tub   Dressing: You may change your dressing on post op day #3.  Then change the dressing daily with sterile 4"x4"s gauze dressing  And TED hose for knees. Use paper tape to hold dressing in place  For hips. You may clean the incision with alcohol prior to redressing.   Weight Bearing: weight bearing as taught in physical therapy. Use a walker or  Crutches as instructed.   To prevent constipation: you may use a stool softener such as -  Colace ( over the counter) 100 mg by mouth twice a day  Drink plenty of fluids ( prune juice may be helpful) and high fiber foods  Miralax ( over the counter) for constipation as needed.   Precautions: If you experience chest pain or shortness of breath - call 911 immediately For transfer to the hospital emergency department!!  If you develop a fever greater that 101 F, purulent drainage from wound, increased redness or drainage from wound, or calf pain -- Call the office   Follow- Up Appointment: Please call for an appointment to be seen in 2 weeks  [place] - [phone]

## 2014-08-08 NOTE — Anesthesia Preprocedure Evaluation (Addendum)
Anesthesia Evaluation  Patient identified by MRN, date of birth, ID band Patient awake    Reviewed: Allergy & Precautions, NPO status , Patient's Chart, lab work & pertinent test results  Airway Mallampati: II  TM Distance: >3 FB Neck ROM: Full    Dental  (+) Dental Advisory Given, Teeth Intact   Pulmonary former smoker,  breath sounds clear to auscultation        Cardiovascular hypertension, Pt. on medications Rhythm:Regular Rate:Normal     Neuro/Psych negative neurological ROS     GI/Hepatic Neg liver ROS, hiatal hernia, GERD-  Medicated,  Endo/Other  negative endocrine ROS  Renal/GU negative Renal ROS     Musculoskeletal negative musculoskeletal ROS (+)   Abdominal   Peds  Hematology negative hematology ROS (+)   Anesthesia Other Findings   Reproductive/Obstetrics                          Anesthesia Physical Anesthesia Plan  ASA: II  Anesthesia Plan: General   Post-op Pain Management:    Induction: Intravenous  Airway Management Planned: Oral ETT  Additional Equipment:   Intra-op Plan:   Post-operative Plan: Extubation in OR  Informed Consent: I have reviewed the patients History and Physical, chart, labs and discussed the procedure including the risks, benefits and alternatives for the proposed anesthesia with the patient or authorized representative who has indicated his/her understanding and acceptance.   Dental advisory given  Plan Discussed with: CRNA, Anesthesiologist and Surgeon  Anesthesia Plan Comments:        Anesthesia Quick Evaluation

## 2014-08-09 LAB — CBC
HCT: 34.5 % — ABNORMAL LOW (ref 39.0–52.0)
Hemoglobin: 11.8 g/dL — ABNORMAL LOW (ref 13.0–17.0)
MCH: 33.1 pg (ref 26.0–34.0)
MCHC: 34.2 g/dL (ref 30.0–36.0)
MCV: 96.9 fL (ref 78.0–100.0)
PLATELETS: 87 10*3/uL — AB (ref 150–400)
RBC: 3.56 MIL/uL — ABNORMAL LOW (ref 4.22–5.81)
RDW: 12.4 % (ref 11.5–15.5)
WBC: 9.5 10*3/uL (ref 4.0–10.5)

## 2014-08-09 LAB — BASIC METABOLIC PANEL
ANION GAP: 9 (ref 5–15)
BUN: 15 mg/dL (ref 6–23)
CO2: 26 mmol/L (ref 19–32)
Calcium: 8.7 mg/dL (ref 8.4–10.5)
Chloride: 99 mmol/L (ref 96–112)
Creatinine, Ser: 1.02 mg/dL (ref 0.50–1.35)
GFR calc Af Amer: >90 mL/min (ref >90)
GFR calc non Af Amer: 81 mL/min — ABNORMAL LOW (ref >90)
GLUCOSE: 166 mg/dL — AB (ref 70–99)
Potassium: 4.2 mmol/L (ref 3.5–5.1)
Sodium: 134 mmol/L — ABNORMAL LOW (ref 135–145)

## 2014-08-09 NOTE — Care Management Note (Signed)
CARE MANAGEMENT NOTE 02/12/9449  Patient:  Chad Matthews, COMMON   Account Number:  [id number]  Date Initiated:  08/08/2014  Documentation initiated by:  [facility]  Subjective/Objective Assessment:   s/p rt THA     Action/Plan:   PT is recommending RW.   Anticipated DC Date:  08/10/2014   Anticipated DC Plan:  [place]  CM consult      [facility] Choice  DURABLE MEDICAL EQUIPMENT   Choice offered to / List presented to:  C-1 Patient   DME arranged  3-N-1  Vassie Moselle      DME agency  [place] arranged  [place]   Status of service:  In process, will continue to follow Medicare Important Message given?   (If response is "NO", the following Medicare IM given date fields will be blank) Date Medicare IM given:   Medicare IM given by:   Date Additional Medicare IM given:   Additional Medicare IM given by:    Discharge Disposition:    Per UR Regulation:    If discussed at Long Length of Stay Meetings, dates discussed:    Comments:  08/09/14 [place], RN, BSN  Met with pt and wife. Discharge plan is to return home with the support of his wife. They don't have a preference for a [place] agency for DME. They agreed to use [facility] for DME. Contacted James at [facility] for referral.     08/08/14 Set up with [facility] for HHPT by MD office.

## 2014-08-09 NOTE — Progress Notes (Signed)
Physical Therapy Treatment Patient Details Name: Chad Matthews MRN: [id number] DOB: March 27, 1961 Today's Date: 08/09/2014    History of Present Illness Patient is a 54 yo male admitted 08/08/14 now s/p Rt THA with posterior precautions.  PMH:  HTN, HOH, back pain, arthritis    PT Comments    Patient sweaty during tx which he attributes to medication.  He is progressing nicely towards safe discharge home.  Will re-assess stairs Sunday to assure he is ready for discharge home with his wife. Cont to be fall risk secondary to decr bil step length and decr ability to fully WB on Rt leg.  Follow Up Recommendations  Home health PT;Supervision/Assistance - 24 hour     Equipment Recommendations  Rolling walker with 5" wheels;3in1 (PT)    Recommendations for Other Services       Precautions / Restrictions Precautions Precautions: Posterior Hip Precaution Booklet Issued: No Precaution Comments: reviewed hip precautions-pt able to verbalize 3/3  Restrictions Weight Bearing Restrictions: Yes RLE Weight Bearing: Weight bearing as tolerated    Mobility  Bed Mobility Overal bed mobility: Needs Assistance Bed Mobility: Sit to Supine       Sit to supine: Min assist (leg management)   General bed mobility comments: OOB upon entering room  Transfers Overall transfer level: Needs assistance Equipment used: Rolling walker (2 wheeled) Transfers: Sit to/from Stand Sit to Stand: Min assist (from chair with min cues for hand placement)         General transfer comment: cues for Rt leg management  Ambulation/Gait Ambulation/Gait assistance: Min assist Ambulation Distance (Feet): 80 Feet Assistive device: Rolling walker (2 wheeled) Gait Pattern/deviations: Shuffle;Decreased stance time - right Gait velocity: Decreased   General Gait Details: min cues to inc bil step length   Stairs Stairs: Yes Stairs assistance: Min guard Stair Management: One rail Right;Sideways;Step to  pattern Number of Stairs: 4 General stair comments: mod cues for sequencing; demo to patient/wife  Wheelchair Mobility    Modified Rankin (Stroke Patients Only)       Balance Overall balance assessment: Needs assistance Sitting-balance support: No upper extremity supported;Feet supported Sitting balance-Leahy Scale: Good     Standing balance support: Bilateral upper extremity supported Standing balance-Leahy Scale: Poor Standing balance comment: during static task                    Cognition Arousal/Alertness: Awake/alert Behavior During Therapy: WFL for tasks assessed/performed Overall Cognitive Status: Within Functional Limits for tasks assessed                      Exercises Total Joint Exercises Ankle Circles/Pumps: AROM;Both;10 reps;Seated Quad Sets: AROM;Both;10 reps;Seated Towel Squeeze: AROM;Both;10 reps;Seated Short Arc Quad: Right;10 reps;Seated;AAROM Heel Slides: AAROM;Right;10 reps;Seated Hip ABduction/ADduction: AAROM;Right;10 reps;Seated Straight Leg Raises: AAROM;10 reps;Seated Long Arc Quad: AAROM;Right;10 reps;Seated Knee Flexion: AROM;Both;10 reps;Standing (at [facility]) Marching in Standing: AROM;Right;10 reps;Standing (with RW) Standing Hip Extension: AROM;Both;10 reps;Standing (with RW)    General Comments        Pertinent Vitals/Pain Pain Assessment: 0-10 Pain Score: 5  Pain Location: Rt hip Pain Descriptors / Indicators: Sore Pain Intervention(s): Limited activity within patient's tolerance;Monitored during session    Home Living                      Prior Function            PT Goals (current goals can now be found in the care plan section) Acute Rehab  PT Goals Patient Stated Goal: be able to walk and return to work PT Goal Formulation: With patient/family Time For Goal Achievement: 08/15/14 Potential to Achieve Goals: Good Progress towards PT goals: Progressing toward goals    Frequency  7X/week    PT  Plan Current plan remains appropriate    Co-evaluation             End of Session Equipment Utilized During Treatment: Gait belt Activity Tolerance: Patient tolerated treatment well Patient left: in chair;with call bell/phone within reach;with family/visitor present     Time: 6295-2841 PT Time Calculation (min) (ACUTE ONLY): 26 min  Charges:  $Gait Training: 8-22 mins $Therapeutic Exercise: 8-22 mins                    G CodesMalka So, [place] [phone] [place] 08/09/2014, 4:34 PM

## 2014-08-09 NOTE — Progress Notes (Signed)
Physical Therapy Treatment Patient Details Name: Chad Matthews MRN: [id number] DOB: Apr 09, 1961 Today's Date: 08/09/2014    History of Present Illness Patient is a 54 yo male admitted 08/08/14 now s/p Rt THA with posterior precautions.  PMH:  HTN, HOH, back pain, arthritis    PT Comments    Patient making steady gains in PT.  Family present to observe his mobility needs.  Patient may benefit from cont skilled PT on this venue of care to progress mobility to ensure safe discharge home. Patient seen BID.  Follow Up Recommendations  Home health PT;Supervision/Assistance - 24 hour     Equipment Recommendations  Rolling walker with 5" wheels;3in1 (PT)    Recommendations for Other Services       Precautions / Restrictions Precautions Precautions: Posterior Hip Precaution Booklet Issued: No Precaution Comments: reviewed hip precautions-pt able to verbalize 3/3  Restrictions Weight Bearing Restrictions: Yes RLE Weight Bearing: Weight bearing as tolerated    Mobility  Bed Mobility               General bed mobility comments: OOB upon entering room  Transfers Overall transfer level: Needs assistance Equipment used: Rolling walker (2 wheeled) Transfers: Sit to/from Stand Sit to Stand: Min assist (from chair with min cues for hand placement)         General transfer comment: cues for Rt leg management  Ambulation/Gait Ambulation/Gait assistance: Min assist Ambulation Distance (Feet): 80 Feet Assistive device: Rolling walker (2 wheeled) Gait Pattern/deviations: Shuffle;Decreased stance time - right Gait velocity: Decreased   General Gait Details: min cues to inc bil step length   Stairs            Wheelchair Mobility    Modified Rankin (Stroke Patients Only)       Balance Overall balance assessment: Needs assistance Sitting-balance support: No upper extremity supported;Feet supported Sitting balance-Leahy Scale: Good     Standing balance  support: Bilateral upper extremity supported Standing balance-Leahy Scale: Poor                      Cognition Arousal/Alertness: Awake/alert Behavior During Therapy: WFL for tasks assessed/performed Overall Cognitive Status: Within Functional Limits for tasks assessed                      Exercises Total Joint Exercises Ankle Circles/Pumps: AROM;Both;10 reps;Seated Quad Sets: AROM;Both;10 reps;Seated Towel Squeeze: AROM;Both;10 reps;Seated Short Arc Quad: Right;10 reps;Seated;AAROM Heel Slides: AAROM;Right;10 reps;Seated Hip ABduction/ADduction: AAROM;Right;10 reps;Seated Straight Leg Raises: AAROM;10 reps;Seated Long Arc Quad: AAROM;Right;10 reps;Seated    General Comments        Pertinent Vitals/Pain Pain Assessment: 0-10 Pain Score: 5  Pain Location: Rt hip Pain Descriptors / Indicators: Sore Pain Intervention(s): Limited activity within patient's tolerance;Monitored during session    Home Living                      Prior Function            PT Goals (current goals can now be found in the care plan section) Acute Rehab PT Goals Patient Stated Goal: be able to walk and return to work PT Goal Formulation: With patient/family Time For Goal Achievement: 08/15/14 Potential to Achieve Goals: Good Progress towards PT goals: Progressing toward goals    Frequency  7X/week    PT Plan Current plan remains appropriate    Co-evaluation             End of  Session Equipment Utilized During Treatment: Gait belt Activity Tolerance: Patient tolerated treatment well Patient left: in chair;with call bell/phone within reach;with family/visitor present     Time: 8242-3536 PT Time Calculation (min) (ACUTE ONLY): 26 min  Charges:  $Gait Training: 8-22 mins $Therapeutic Exercise: 8-22 mins                    G CodesMalka So, [place] [phone] [place] 08/09/2014, 4:20 PM

## 2014-08-09 NOTE — Progress Notes (Signed)
Orthopaedic Trauma Service Progress Note  Subjective  Doing great overall No specific complaints other than some soreness in R hip/thigh Not much of an appetite but did eat some breakfast Voiding without difficulty + flatus   Thrombocytopenia is hereditary per report   Review of Systems  Constitutional: Negative for fever and chills.  Respiratory: Negative for shortness of breath and wheezing.   Cardiovascular: Negative for chest pain and palpitations.  Gastrointestinal: Negative for nausea and vomiting.  Genitourinary: Negative for dysuria and urgency.  Neurological: Negative for tingling and sensory change.     Objective   BP 157/98 mmHg  Pulse 98  Temp(Src) 98.6 F (37 C) (Oral)  Resp 14  Ht 6' 0.5" (1.842 m)  Wt 104.412 kg (230 lb 3 oz)  BMI 30.77 kg/m2  SpO2 96%  Intake/Output      03/11 0701 - 03/12 0700 03/12 0701 - 03/13 0700   I.V. (mL/kg) 1400 (13.4)    Total Intake(mL/kg) 1400 (13.4)    Urine (mL/kg/hr) 550 (0.2) 200 (0.8)   Blood 750 (0.3)    Total Output 1300 200   Net +100 -200        Urine Occurrence  1 x     Labs  Results for Chad, Matthews (MRN [id number]) as of 08/09/2014 09:28  Ref. Range 08/09/2014 06:35  Sodium Latest Range: 135-145 mmol/L 134 (L)  Potassium Latest Range: 3.5-5.1 mmol/L 4.2  Chloride Latest Range: 96-112 mmol/L 99  CO2 Latest Range: 19-32 mmol/L 26  BUN Latest Range: 6-23 mg/dL 15  Creatinine Latest Range: 0.50-1.35 mg/dL 1.02  Calcium Latest Range: 8.4-10.5 mg/dL 8.7  GFR calc non Af Amer Latest Range: >90 mL/min 81 (L)  GFR calc Af Amer Latest Range: >90 mL/min >90  Glucose Latest Range: 70-99 mg/dL 166 (H)  Anion gap Latest Range: 5-15  9  WBC Latest Range: 4.0-10.5 K/uL 9.5  RBC Latest Range: 4.22-5.81 MIL/uL 3.56 (L)  Hemoglobin Latest Range: 13.0-17.0 g/dL 11.8 (L)  HCT Latest Range: 39.0-52.0 % 34.5 (L)  MCV Latest Range: 78.0-100.0 fL 96.9  MCH Latest Range: 26.0-34.0 pg 33.1  MCHC Latest Range:  30.0-36.0 g/dL 34.2  RDW Latest Range: 11.5-15.5 % 12.4  Platelets Latest Range: 150-400 K/uL 87 (L)    Exam  Gen: awake, alert, appears comfortable, good spirits Lungs: clear B  Cardiac: s1 and s2, RRR Abd: + BS, NTND Ext:       Right Lower Extremity   Dressing c/d/i  Ext warm  + DP pulse  No DCT  Compartments soft  DPN, SPN, TN sensation intact  EHL, FHL, AT, PT, peroneals, gastroc motor intact   Assessment and Plan   POD/HD#: 1   54 y/o male s/p R THA  1. R hip DJD s/p R THA  WBAT   Posterior hip precautions  PT/OT  Dressing change tomorrow  Ice PRN    2. Pain management:  Continue with current regimen  3. ABL anemia/Hemodynamics  Stable  Monitor   4. Medical issues   HTN- home meds   Monitor   5. DVT/PE prophylaxis:  Lovenox- if plts dec further will hold  SCDs  6. ID:   Completed periop abx  7. FEN/Foley/Lines:  Diet as tolerated    8. Dispo:  Continue with therapies  Probable dc home tomorrow    Jari Pigg, PA-C [facility] [phone] (O) 08/09/2014 9:26 AM

## 2014-08-09 NOTE — Clinical Social Work Note (Signed)
CSW received consult for SNF. CSW reviewed chart and OT recommendation for no OT follow-up and PT recommendation for HHPT. CSW met with patient and his wife who was present at bedside. Per patient's wife, she and patient's mother will assist patient. Patient's wife states she will be able to assist patient as he is "getting around pretty good." Patient states wife and mother will be able to assist and supervise "accordingly." No further needs. CSW signing off.  [place], [place] Weekend Clinical Social Worker [phone]

## 2014-08-09 NOTE — Evaluation (Signed)
Occupational Therapy Evaluation Patient Details Name: Chad Matthews MRN: [id number] DOB: Jun 22, 1960 Today's Date: 08/09/2014    History of Present Illness Patient is a 54 yo male admitted 08/08/14 now s/p Rt THA with posterior precautions.  PMH:  HTN, HOH, back pain, arthritis   Clinical Impression   Pt s/p above. Pt independent with ADLs, PTA. Feel pt will benefit from acute OT to increase independence with BADLs, prior to d/c. Plan to practice tub or shower transfer and LB dressing with AE next session.    Follow Up Recommendations  No OT follow up;Supervision - Intermittent    Equipment Recommendations  3 in 1 bedside comode;Other (comment) (sockaid)    Recommendations for Other Services       Precautions / Restrictions Precautions Precautions: Posterior Hip Precaution Booklet Issued: No Precaution Comments: reviewed hip precautions-pt able to verbalize 3/3  Restrictions Weight Bearing Restrictions: Yes RLE Weight Bearing: Weight bearing as tolerated      Mobility Bed Mobility Overal bed mobility: Needs Assistance Bed Mobility: Supine to Sit     Supine to sit: Supervision     General bed mobility comments: pt able to manage without physical assistance.  Transfers Overall transfer level: Needs assistance Equipment used: Rolling walker (2 wheeled) Transfers: Sit to/from Stand Sit to Stand: Min guard         General transfer comment: cues for technique.    Balance    Min guard for ambulation with RW.                                        ADL Overall ADL's : Needs assistance/impaired                     Lower Body Dressing: Minimal assistance;With adaptive equipment;Sit to/from stand   Toilet Transfer: Min guard;Ambulation;RW (bed/chair)           Functional mobility during ADLs: Min guard;Rolling walker General ADL Comments: Educated on AE and LB dressing technique. Educated on safety such as safe shoewear, use of  bag on walker, and recommended someone be with him for shower transfer, and recommended sitting for most of LB ADLs. Pt practiced with reacher and sockaid donning/doffing sock. Educated on tub and shower transfer techniques and options for shower chair. Discussed 3 in 1.     Vision     Perception     Praxis      Pertinent Vitals/Pain Pain Assessment: 0-10 Pain Score:  (6-7) Pain Location: Rt hip Pain Descriptors / Indicators: Burning;Other (Comment) (stiffness) Pain Intervention(s): Monitored during session;Repositioned     Hand Dominance     Extremity/Trunk Assessment Upper Extremity Assessment Upper Extremity Assessment: Overall WFL for tasks assessed   Lower Extremity Assessment Lower Extremity Assessment: Defer to PT evaluation       Communication Communication Communication: No difficulties (history of HOH)   Cognition Arousal/Alertness: Awake/alert Behavior During Therapy: WFL for tasks assessed/performed Overall Cognitive Status: Within Functional Limits for tasks assessed                     General Comments       Exercises Exercises: Other exercises Other Exercises Other Exercises: pt showed OT briefly LE exercises he has been doing   Shoulder Instructions      Home Living Family/patient expects to be discharged to:: Private residence Living Arrangements: Spouse/significant other Available Help at  Discharge: Family;Available 24 hours/day Type of Home: House Home Access: Stairs to enter [employer] of Steps: 4 Entrance Stairs-Rails: Right;Left Home Layout: One level     Bathroom Shower/Tub: Tub/shower unit;Walk-in shower;Tub only (door on walk-in shower)   Bathroom Toilet: Standard (window close)     Home Equipment: Adaptive equipment;Hand held shower head Adaptive Equipment: Reacher;Other (Comment) (may have long shoehorn?; has long handled brush)        Prior Functioning/Environment Level of Independence: Independent              OT Diagnosis: Acute pain   OT Problem List: Decreased knowledge of use of DME or AE;Decreased knowledge of precautions;Pain;Decreased activity tolerance   OT Treatment/Interventions: Self-care/ADL training;DME and/or AE instruction;Therapeutic activities;Patient/family education;Balance training    OT Goals(Current goals can be found in the care plan section) Acute Rehab OT Goals Patient Stated Goal: not stated OT Goal Formulation: With patient/family Time For Goal Achievement: 08/16/14 Potential to Achieve Goals: Good ADL Goals Pt Will Perform Lower Body Dressing: with set-up;with adaptive equipment;sit to/from stand Pt Will Transfer to Toilet: with modified independence;ambulating (3 in 1 over commode) Pt Will Perform Tub/Shower Transfer: Tub transfer;Shower transfer;with supervision;rolling walker;ambulating;3 in 1  OT Frequency: Min 2X/week   Barriers to D/C:            Co-evaluation              End of Session Equipment Utilized During Treatment: Gait belt;Rolling walker  Activity Tolerance: Patient tolerated treatment well Patient left: in chair;with call bell/phone within reach;with family/visitor present   Time: 0383-3383 OT Time Calculation (min): 18 min Charges:  OT General Charges $OT Visit: 1 Procedure OT Evaluation $Initial OT Evaluation Tier I: 1 Procedure G-CodesBenito Mccreedy OTR/L [id number] 08/09/2014, 11:34 AM

## 2014-08-10 DIAGNOSIS — K219 Gastro-esophageal reflux disease without esophagitis: Secondary | ICD-10-CM | POA: Diagnosis present

## 2014-08-10 LAB — CBC
HCT: 32.7 % — ABNORMAL LOW (ref 39.0–52.0)
Hemoglobin: 11 g/dL — ABNORMAL LOW (ref 13.0–17.0)
MCH: 32.4 pg (ref 26.0–34.0)
MCHC: 33.6 g/dL (ref 30.0–36.0)
MCV: 96.5 fL (ref 78.0–100.0)
PLATELETS: 74 10*3/uL — AB (ref 150–400)
RBC: 3.39 MIL/uL — AB (ref 4.22–5.81)
RDW: 12.3 % (ref 11.5–15.5)
WBC: 8.8 10*3/uL (ref 4.0–10.5)

## 2014-08-10 MED ORDER — DOCUSATE SODIUM 100 MG PO CAPS: 100.0000 mg | ORAL_CAPSULE | Freq: Two times a day (BID) | ORAL | Status: DC

## 2014-08-10 MED ORDER — ACETAMINOPHEN 500 MG PO TABS: 500.0000 mg | ORAL_TABLET | Freq: Four times a day (QID) | ORAL | Status: DC | PRN

## 2014-08-10 NOTE — Progress Notes (Signed)
Orthopaedic Trauma Service Progress Note  Subjective  Doing well Pain controlled No new issues Worked with PT this am Walked around floor, felt really good Wants to go home  Voiding w/o difficulty Appetite improved   Review of Systems  Constitutional: Negative for fever and chills.  Respiratory: Negative for shortness of breath and wheezing.   Cardiovascular: Negative for chest pain and palpitations.  Gastrointestinal: Negative for nausea, vomiting and abdominal pain.  Genitourinary: Negative for dysuria.  Neurological: Negative for tingling, sensory change and headaches.     Objective   BP 155/88 mmHg  Pulse 98  Temp(Src) 97.4 F (36.3 C) (Oral)  Resp 16  Ht 6' 0.5" (1.842 m)  Wt 104.412 kg (230 lb 3 oz)  BMI 30.77 kg/m2  SpO2 97%  Intake/Output      03/12 0701 - 03/13 0700 03/13 0701 - 03/14 0700   P.O. 960 240   I.V. (mL/kg)     Total Intake(mL/kg) 960 (9.2) 240 (2.3)   Urine (mL/kg/hr) 1250 (0.5)    Blood     Total Output 1250     Net -290 +240        Urine Occurrence 5 x 1 x     Labs  Results for TYRI, ELMORE (MRN [id number]) as of 08/10/2014 10:17  Ref. Range 08/10/2014 05:50  WBC Latest Range: 4.0-10.5 K/uL 8.8  RBC Latest Range: 4.22-5.81 MIL/uL 3.39 (L)  Hemoglobin Latest Range: 13.0-17.0 g/dL 11.0 (L)  HCT Latest Range: 39.0-52.0 % 32.7 (L)  MCV Latest Range: 78.0-100.0 fL 96.5  MCH Latest Range: 26.0-34.0 pg 32.4  MCHC Latest Range: 30.0-36.0 g/dL 33.6  RDW Latest Range: 11.5-15.5 % 12.3  Platelets Latest Range: 150-400 K/uL PENDING    Exam  Gen: awake, alert, appears comfortable, sitting in chair  Lungs: clear B   Cardiac: s1 and s2, RRR Abd: + BS, NTND Ext:        Right Lower Extremity               Dressing c/d/i             Ext warm             + DP pulse             No DCT             Compartments soft             DPN, SPN, TN sensation intact             EHL, FHL, AT, PT, peroneals, gastroc motor intact     Assessment and Plan   POD/HD#:2    54 y/o male s/p R THA  1. R hip DJD s/p R THA             WBAT               Posterior hip precautions             PT/OT             Dressing changes PRN              Ice PRN               2. Pain management:             Continue with current regimen  3. ABL anemia/Hemodynamics             Stable  Monitor   4. Medical issues               HTN- home meds                         Monitor   5. DVT/PE prophylaxis:             Lovenox             SCDs  6. ID:               Completed periop abx  7. FEN/Foley/Lines:             Diet as tolerated    8. Dispo:             Continue with therapies             dc home tomorrow    Follow up with Dr. French Ana in 10-14 days   Jari Pigg, PA-C [facility] [phone] (O) 08/10/2014 10:17 AM

## 2014-08-10 NOTE — Progress Notes (Signed)
Physical Therapy Treatment Patient Details Name: Chad Matthews MRN: [id number] DOB: 1961-05-30 Today's Date: 08/10/2014    History of Present Illness Patient is a 54 yo male admitted 08/08/14 now s/p Rt THA with posterior precautions.  PMH:  HTN, HOH, back pain, arthritis    PT Comments    Overall continuing to progress well; discussed dc home, car transfers; Pt verbalizes and demonstrates Posterior Hip Prec with mobility tasks; OK for dc home from PT standpoint   Follow Up Recommendations  Home health PT;Supervision/Assistance - 24 hour     Equipment Recommendations  Rolling walker with 5" wheels;3in1 (PT)    Recommendations for Other Services       Precautions / Restrictions Precautions Precautions: Posterior Hip Precaution Booklet Issued: Yes (comment) Precaution Comments: reviewed hip precautions-pt able to verbalize 3/3  Restrictions RLE Weight Bearing: Weight bearing as tolerated    Mobility  Bed Mobility               General bed mobility comments: Reports he has confidence sith ability to get in and out of bed while observing precautions  Transfers Overall transfer level: Needs assistance Equipment used: Rolling walker (2 wheeled) Transfers: Sit to/from Stand Sit to Stand: Supervision         General transfer comment: Noted good maintenance of post prec  Ambulation/Gait Ambulation/Gait assistance: Supervision Ambulation Distance (Feet): 320 Feet Assistive device: Rolling walker (2 wheeled) Gait Pattern/deviations: Step-through pattern (emerging step-through) Gait velocity: Decreased   General Gait Details: Noting naturally stepping through; no problems with balance   Stairs         General stair comments: Reports no problems with steps yesterday  Wheelchair Mobility    Modified Rankin (Stroke Patients Only)       Balance                                    Cognition Arousal/Alertness: Awake/alert Behavior  During Therapy: WFL for tasks assessed/performed Overall Cognitive Status: Within Functional Limits for tasks assessed                      Exercises Total Joint Exercises Ankle Circles/Pumps: AROM;Both;10 reps;Seated Hip ABduction/ADduction: AROM;Right;10 reps;Standing Marching in Standing: AROM;Right;10 reps;Standing Standing Hip Extension: AROM;Both;10 reps;Standing    General Comments        Pertinent Vitals/Pain Pain Assessment: 0-10 Pain Score: 5  Pain Location: R hip Pain Descriptors / Indicators: Sore (Stiffness) Pain Intervention(s): Monitored during session (Reports walking helps decr pain)    Home Living                      Prior Function            PT Goals (current goals can now be found in the care plan section) Acute Rehab PT Goals Patient Stated Goal: be able to walk and return to work PT Goal Formulation: With patient/family Time For Goal Achievement: 08/15/14 Potential to Achieve Goals: Good Progress towards PT goals: Progressing toward goals    Frequency  7X/week    PT Plan Current plan remains appropriate    Co-evaluation             End of Session   Activity Tolerance: Patient tolerated treatment well Patient left: in chair;with call bell/phone within reach     Time: 2993-7169 PT Time Calculation (min) (ACUTE ONLY): 16 min  Charges:  $Gait  Training: 8-22 mins                    G Codes:      Chad Matthews [facility] 08/10/2014, 8:48 AM  Chad Matthews, [place] Pager [phone] Office [phone]

## 2014-08-10 NOTE — Progress Notes (Signed)
Occupational Therapy Treatment Patient Details Name: Chad Matthews MRN: [id number] DOB: Apr 09, 1961 Today's Date: 08/10/2014    History of present illness Patient is a 54 yo male admitted 08/08/14 now s/p Rt THA with posterior precautions.  PMH:  HTN, HOH, back pain, arthritis   OT comments  Pt moving well. Education provided to pt/spouse. Feel pt is safe to d/c home, from OT standpoint.  Follow Up Recommendations  No OT follow up;Supervision - Intermittent    Equipment Recommendations  3 in 1 bedside comode;Other (comment) (sockaid)    Recommendations for Other Services      Precautions / Restrictions Precautions Precautions: Posterior Hip Precaution Booklet Issued: No Precaution Comments: reviewed hip precautions Restrictions Weight Bearing Restrictions: Yes RLE Weight Bearing: Weight bearing as tolerated       Mobility Bed Mobility Overal bed mobility: Modified Independent             General bed mobility comments: practiced without rails   Transfers Overall transfer level: Needs assistance Equipment used: Rolling walker (2 wheeled) Transfers: Sit to/from Stand Sit to Stand: Supervision         General transfer comment: cues for technique.    Balance  No LOB in session. Supervision for ambulation with RW.                                 ADL Overall ADL's : Needs assistance/impaired                     Lower Body Dressing: Set up;Supervision/safety;With adaptive equipment;Sit to/from stand   Toilet Transfer: Supervision/safety;Ambulation;RW (chair)       Tub/ Shower Transfer: Ambulation;Rolling walker;Walk-in shower;Supervision/safety   Functional mobility during ADLs: Supervision/safety;Rolling walker General ADL Comments: Educated on shower transfer technique (explained for them to ask home health PT if walker does not fit in shower, but wife reports she thinks it will). Reviewed LB dressing technique and AE and pt  practiced donning underwear. Educated on safety such as safe shoewear, pets, rugs/items on floor, sitting for most of LB ADLs, safe shoewear, and recommeded spouse be with him for shower transfer and bathing.       Vision                     Perception     Praxis      Cognition  Awake/Alert Behavior During Therapy: WFL for tasks assessed/performed Overall Cognitive Status: Within Functional Limits for tasks assessed                       Extremity/Trunk Assessment                  Shoulder Instructions       General Comments      Pertinent Vitals/ Pain       Pain Assessment: 0-10 Pain Score: 4  Pain Location: Rt hip Pain Intervention(s): Repositioned;Monitored during session  Home Living                                          Prior Functioning/Environment              Frequency Min 2X/week     Progress Toward Goals  OT Goals(current goals can now be found in the care plan section)  Progress towards OT goals: Progressing toward goals  Acute Rehab OT Goals Patient Stated Goal: not stated OT Goal Formulation: With patient/family Time For Goal Achievement: 08/16/14 Potential to Achieve Goals: Good ADL Goals Pt Will Perform Lower Body Dressing: with set-up;with adaptive equipment;sit to/from stand Pt Will Transfer to Toilet: with modified independence;ambulating (3 in 1 over commode) Pt Will Perform Tub/Shower Transfer: Tub transfer;Shower transfer;with supervision;rolling walker;ambulating;3 in 1  Plan Discharge plan remains appropriate    Co-evaluation                 End of Session Equipment Utilized During Treatment: Rolling walker;Gait belt   Activity Tolerance Patient tolerated treatment well   Patient Left in chair;with call bell/phone within reach;with family/visitor present   Nurse Communication          Time: [phone] OT Time Calculation (min): 16 min  Charges: OT General Charges $OT  Visit: 1 Procedure OT Treatments $Self Care/Home Management : 8-22 mins  Benito Mccreedy OTR/L [phone] 08/10/2014, 9:47 AM

## 2014-08-10 NOTE — Discharge Summary (Signed)
Orthopaedic Trauma Service (OTS)  Patient ID: Chad Matthews MRN: [id number] DOB/AGE: 1961-04-24 54 y.o.  Admit date: 08/08/2014 Discharge date: 08/10/2014  Admission Diagnoses: Arthritis right hip Hypertension GERD   Discharge Diagnoses:  Active Problems:   Primary localized osteoarthritis of right hip   Hypertension   GERD (gastroesophageal reflux disease)   Procedures Performed: 08/08/2014- Dr. French Ana 1. Right total hip arthroplasty  Discharged Condition: good  Hospital Course:   54 year old white male admitted on 08/08/2014 for right total hip arthroplasty for endstage DJD right hip. Patient was taken to the operating room on the date noted above for the procedure described up above. He tolerated the procedure well. After surgery was transferred to the orthopedic floor for observation, pain control and to begin therapies. Patient's hospital stay was uncomplicated. He began to work with physical therapy on postoperative day #1 and was started on Lovenox for DVT and PE prophylaxis on postoperative day #1. The patient progressed very well during his hospitalization. Ultimately on postoperative day #2 he was deemed to be stable for discharge to home with home health. No other issues or concerns were noted during his hospital stay. At the time of discharge patient was tolerating diet. He was voiding without difficulty.  Consults: None  Significant Diagnostic Studies: labs:   Results for Chad, Matthews (MRN [id number]) as of 08/10/2014 10:17  Ref. Range 08/10/2014 05:50  WBC Latest Range: 4.0-10.5 K/uL 8.8  RBC Latest Range: 4.22-5.81 MIL/uL 3.39 (L)  Hemoglobin Latest Range: 13.0-17.0 g/dL 11.0 (L)  HCT Latest Range: 39.0-52.0 % 32.7 (L)  MCV Latest Range: 78.0-100.0 fL 96.5  MCH Latest Range: 26.0-34.0 pg 32.4  MCHC Latest Range: 30.0-36.0 g/dL 33.6  RDW Latest Range: 11.5-15.5 % 12.3  Platelets Latest Range: 150-400 K/uL PENDING   Treatments: IV hydration,  antibiotics: Ancef, analgesia: acetaminophen and oxycodone, anticoagulation: LMW heparin, therapies: PT, OT and RN and surgery: As above  Discharge Exam:  Orthopaedic Trauma Service Progress Note  Subjective  Doing well Pain controlled No new issues Worked with PT this am Walked around floor, felt really good Wants to go home  Voiding w/o difficulty Appetite improved   Review of Systems  Constitutional: Negative for fever and chills.  Respiratory: Negative for shortness of breath and wheezing.   Cardiovascular: Negative for chest pain and palpitations.  Gastrointestinal: Negative for nausea, vomiting and abdominal pain.  Genitourinary: Negative for dysuria.  Neurological: Negative for tingling, sensory change and headaches.     Objective   BP 155/88 mmHg  Pulse 98  Temp(Src) 97.4 F (36.3 C) (Oral)  Resp 16  Ht 6' 0.5" (1.842 m)  Wt 104.412 kg (230 lb 3 oz)  BMI 30.77 kg/m2  SpO2 97%  Intake/Output       03/12 0701 - 03/13 0700 03/13 0701 - 03/14 0700    P.O. 960 240    I.V. (mL/kg)      Total Intake(mL/kg) 960 (9.2) 240 (2.3)    Urine (mL/kg/hr) 1250 (0.5)     Blood      Total Output 1250      Net -290 +240          Urine Occurrence 5 x 1 x      Labs  Results for Chad, Matthews (MRN [id number]) as of 08/10/2014 10:17   Ref. Range  08/10/2014 05:50   WBC  Latest Range: 4.0-10.5 K/uL  8.8   RBC  Latest Range: 4.22-5.81 MIL/uL  3.39 (L)  Hemoglobin  Latest Range: 13.0-17.0 g/dL  11.0 (L)   HCT  Latest Range: 39.0-52.0 %  32.7 (L)   MCV  Latest Range: 78.0-100.0 fL  96.5   MCH  Latest Range: 26.0-34.0 pg  32.4   MCHC  Latest Range: 30.0-36.0 g/dL  33.6   RDW  Latest Range: 11.5-15.5 %  12.3   Platelets  Latest Range: 150-400 K/uL  PENDING     Exam  Gen: awake, alert, appears comfortable, sitting in chair   Lungs: clear B   Cardiac: s1 and s2, RRR Abd: + BS, NTND Ext:        Right Lower Extremity               Dressing c/d/i             Ext  warm             + DP pulse             No DCT             Compartments soft             DPN, SPN, TN sensation intact             EHL, FHL, AT, PT, peroneals, gastroc motor intact    Assessment and Plan   POD/HD#:2    54 y/o male s/p R THA  1. R hip DJD s/p R THA             WBAT               Posterior hip precautions             PT/OT             Dressing changes PRN               Ice PRN               2. Pain management:             Continue with current regimen  3. ABL anemia/Hemodynamics             Stable             Monitor   4. Medical issues               HTN- home meds                         Monitor   5. DVT/PE prophylaxis:             Lovenox             SCDs  6. ID:               Completed periop abx  7. FEN/Foley/Lines:             Diet as tolerated    8. Dispo:             Continue with therapies             dc home tomorrow               Follow up with Dr. French Ana in 10-14 days   Jari Pigg, PA-C [facility] [phone] (O) 08/10/2014 10:17 AM   Disposition: Home with home health    Medication List    TAKE these medications        acetaminophen  650 MG CR tablet  Commonly known as:  TYLENOL  Take 1,300 mg by mouth every 12 (twelve) hours as needed for pain.     acetaminophen 500 MG tablet  Commonly known as:  TYLENOL  Take 1-2 tablets (500-1,000 mg total) by mouth every 6 (six) hours as needed for mild pain, moderate pain or fever.     amLODipine 2.5 MG tablet  Commonly known as:  NORVASC  Take 2.5 mg by mouth daily.     docusate sodium 100 MG capsule  Commonly known as:  COLACE  Take 1 capsule (100 mg total) by mouth 2 (two) times daily.     enoxaparin 30 MG/0.3ML injection  Commonly known as:  LOVENOX  Inject 0.3 mLs (30 mg total) into the skin every 12 (twelve) hours.     famotidine 20 MG tablet  Commonly known as:  PEPCID  Take 20 mg by mouth at bedtime.     methocarbamol 500 MG  tablet  Commonly known as:  ROBAXIN  Take 1 tablet (500 mg total) by mouth 4 (four) times daily.     oxyCODONE 5 MG immediate release tablet  Commonly known as:  ROXICODONE  Take 1 tablet (5 mg total) by mouth every 4 (four) hours as needed for severe pain.       Follow-up Information    Follow up with CAFFREY JR,W D, MD. Schedule an appointment as soon as possible for a visit in 2 weeks.   Specialty:  Orthopedic Surgery   Contact information:   [place] [address] [phone]      Signed:  Jari Pigg, PA-C [facility] [phone] (P) 08/10/2014, 10:26 AM

## 2014-08-11 NOTE — Op Note (Signed)
Chad Matthews, Chad Matthews NO.:  [id number]  MEDICAL RECORD NO.:  [id number]  LOCATION:                                 FACILITY:  PHYSICIAN:  Lockie Pares, M.D.    DATE OF BIRTH:  Oct 27, 1960  DATE OF PROCEDURE:  08/08/2014 DATE OF DISCHARGE:  08/10/2014                              OPERATIVE REPORT   PREOPERATIVE DIAGNOSES:  Severe osteoarthritis, right hip.  Diagnosis of primary osteoarthritis.  POSTOPERATIVE DIAGNOSES:  Severe osteoarthritis, right hip.  Diagnosis of primary osteoarthritis.  OPERATION:  Right total hip replacement (DePuy AML large stature stem 13.5 mm, +1.5 mm neck length, 36-mm ceramic hip ball, acetabulum is a 56 mm sector cup with 1 screw with 10-degree lip liner).  SURGEON:  Lockie Pares, M.D.  ASSISTANT:  Chriss Czar, PA-C  BLOOD LOSS:  Approximately 300.  General anesthetics.  PROCEDURE:  Sterile prep and drape in the lateral position.  Posterior approach to the hip made with splitting the iliotibial band, vastus lateralis, and the gluteus maximus fascia.  T capsulotomy was made in the hip.  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  He had eroded uniformly in the acetabulum superiorly and inferiorly creating a large amount of osteophytic ridging and at the conclusion of placing the cup, we trimmed a large amount of osteophytes from the anterior-inferior acetabulum.  We reamed down to the level of the fovea on the acetabular side but prior to this cut the head about 1 fingerbreadth above the lesser trochanter and progressively reamed for a 13 mm to accept the 13.5 mm stem with 0.5 mm under ream and eventually settled on the large stature stem with large stature rasping.  Acetabular retractors were placed anteriorly and inferiorly with 2 wings superiorly, identified the rim of the acetabulum which again was routed uniformly with the large osteophytic ridging that we noted.  We  then reamed with approximately 10-15 degrees of anteversion and approximately 50 degrees of abduction on the acetabular side.  We then placed the trial cup with good resistance in the final cup.  We then trialed off the cup with the trial lip liner with the trial broach.  The patient could flex to 90 degrees, internally rotated to 60-70 in full adduction was possible with only slight tendency to be unstable with extremes of motion.  Leg lengths appear approximately equal.  We did take an intraoperative x-ray at the conclusion of the case and confirmed good position of all components.  The final femoral stem was placed and we trialed off that and set it up with 1.5 mm neck length due to the patient's age.  Put a ceramic head in on the stem.  Final polyethylene was placed with a 10-degree lip liner approximately at 10 o'clock position for the right hip.  All parameters for stability were deemed to be acceptable.  Copious irrigation perioperatively was used.  Closure was affected with #1 Ethibond on the capsule as well as the iliotibial band, gluteus maximus, with 0, 2-0 Vicryl, staples on the skin.  Marcaine with epinephrine of the skin. Lightly compressive sterile dressing applied.  Knee immobilizer.  Taken to  recovery room in stable condition.     Lockie Pares, M.D.     WDC/MEDQ  D:  08/08/2014  T:  08/09/2014  Job:  [id number]

## 2014-08-12 ENCOUNTER — Encounter (HOSPITAL_COMMUNITY): Payer: Self-pay | Admitting: Orthopedic Surgery

## 2014-10-30 ENCOUNTER — Ambulatory Visit (INDEPENDENT_AMBULATORY_CARE_PROVIDER_SITE_OTHER): Payer: BLUE CROSS/BLUE SHIELD | Admitting: Family Medicine

## 2014-10-30 ENCOUNTER — Encounter: Payer: Self-pay | Admitting: Family Medicine

## 2014-10-30 VITALS — BP 156/98 | HR 84 | Temp 98.6°F | Resp 16 | Ht 72.5 in | Wt 241.0 lb

## 2014-10-30 DIAGNOSIS — M545 Low back pain, unspecified: Secondary | ICD-10-CM | POA: Insufficient documentation

## 2014-10-30 DIAGNOSIS — R600 Localized edema: Secondary | ICD-10-CM

## 2014-10-30 DIAGNOSIS — Z87891 Personal history of nicotine dependence: Secondary | ICD-10-CM | POA: Insufficient documentation

## 2014-10-30 DIAGNOSIS — M25559 Pain in unspecified hip: Secondary | ICD-10-CM | POA: Insufficient documentation

## 2014-10-30 DIAGNOSIS — R739 Hyperglycemia, unspecified: Secondary | ICD-10-CM | POA: Insufficient documentation

## 2014-10-30 DIAGNOSIS — K449 Diaphragmatic hernia without obstruction or gangrene: Secondary | ICD-10-CM | POA: Insufficient documentation

## 2014-10-30 DIAGNOSIS — H9319 Tinnitus, unspecified ear: Secondary | ICD-10-CM | POA: Insufficient documentation

## 2014-10-30 DIAGNOSIS — I10 Essential (primary) hypertension: Secondary | ICD-10-CM

## 2014-10-30 DIAGNOSIS — R609 Edema, unspecified: Secondary | ICD-10-CM | POA: Insufficient documentation

## 2014-10-30 DIAGNOSIS — IMO0002 Reserved for concepts with insufficient information to code with codable children: Secondary | ICD-10-CM | POA: Insufficient documentation

## 2014-10-30 DIAGNOSIS — F1010 Alcohol abuse, uncomplicated: Secondary | ICD-10-CM | POA: Insufficient documentation

## 2014-10-30 MED ORDER — TRIAMTERENE-HCTZ 37.5-25 MG PO TABS: 1.0000 | ORAL_TABLET | Freq: Every day | ORAL | Status: DC

## 2014-10-30 NOTE — Progress Notes (Signed)
Subjective:    Patient ID: Chad Matthews, male    DOB: [birth year], 54 y.o.   MRN: [id number]   Pt comes in today complaining of bilateral leg edema.  He reports it started about three weeks ago.  The Edema has improved since he has discontinued his Amlodipine.      Hypertension This is a chronic problem. The problem is uncontrolled. Associated symptoms include peripheral edema. Pertinent negatives include no chest pain, headaches, neck pain, palpitations or shortness of breath. Compliance problems include medication side effects.         Review of Systems  Constitutional: Positive for diaphoresis (Especially at night) and fatigue (Pt reports this is improving.). Negative for fever, chills, activity change and appetite change.  Respiratory: Negative for apnea, cough, choking, chest tightness, shortness of breath and wheezing.   Cardiovascular: Positive for leg swelling. Negative for chest pain and palpitations.  Gastrointestinal: Negative for nausea, vomiting, abdominal pain, diarrhea, constipation, blood in stool and rectal pain.  Musculoskeletal: Negative for myalgias, back pain, joint swelling, arthralgias, gait problem, neck pain and neck stiffness.  Neurological: Positive for light-headedness (Only in the mornings.). Negative for dizziness, numbness and headaches.   Patient Active Problem List   Diagnosis Date Noted  . AA (alcohol abuse) 10/30/2014  . Accumulation of fluid in tissues 10/30/2014  . 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 Arthralgia of hip 10/30/2014  . History of tobacco use 10/30/2014  . Blood glucose elevated 10/30/2014  . LBP (low back pain) 10/30/2014  . Change in blood platelet count 10/30/2014  . Buzzing in ear 10/30/2014  . GERD (gastroesophageal reflux disease)   . Primary localized osteoarthritis of right hip 08/08/2014  . Pre-operative cardiovascular examination 07/18/2014  . Essential hypertension 07/18/2014   Family History  Problem Relation  Age of Onset  . Healthy Mother   . Cancer Father   . Stroke Father   . Transient ischemic attack Father   . Lung cancer Father   . Healthy Brother   . Healthy Brother     History   Social History  . Marital Status: Married    Spouse Name: Hilda Blades  . Number of Children: N/A  . Years of Education: College   Occupational History  . Service Technician     Full-Time   Social History Main Topics  . Smoking status: Former Smoker -- 0.50 packs/day for 10 years    Types: Cigarettes    Quit date: 07/25/1984  . Smokeless tobacco: Never Used  . Alcohol Use: 8.4 oz/week    14 Cans of beer per week     Comment: daily  . Drug Use: No  . Sexual Activity: Not on file   Other Topics Concern  . Not on file   Social History Narrative    Past Surgical History  Procedure Laterality Date  . Bicep surgery Right   . Elbow surgery Left 1979  . Total hip arthroplasty Right 08/08/2014    Procedure: RIGHT TOTAL HIP ARTHROPLASTY;  Surgeon: Earlie Server, MD;  Location: [place];  Service: Orthopedics;  Laterality: Right;  . Hernia repair  1969    Allergies  Allergen Reactions  . Bee Venom Anaphylaxis  . Lisinopril Cough   Current Outpatient Prescriptions on File Prior to Visit  Medication Sig Dispense Refill  . famotidine (PEPCID) 20 MG tablet Take 20 mg by mouth at bedtime.    Marland Kitchen acetaminophen (TYLENOL) 500 MG tablet Take 1-2 tablets (500-1,000 mg total) by mouth every 6 (six) hours  as needed for mild pain, moderate pain or fever. (Patient not taking: Reported on 10/30/2014) 30 tablet 0  . acetaminophen (TYLENOL) 650 MG CR tablet Take 1,300 mg by mouth every 12 (twelve) hours as needed for pain.    Marland Kitchen docusate sodium (COLACE) 100 MG capsule Take 1 capsule (100 mg total) by mouth 2 (two) times daily. (Patient not taking: Reported on 10/30/2014) 30 capsule 0  . enoxaparin (LOVENOX) 30 MG/0.3ML injection Inject 0.3 mLs (30 mg total) into the skin every 12 (twelve) hours. (Patient not taking: Reported  on 10/30/2014) 24 Syringe 0  . methocarbamol (ROBAXIN) 500 MG tablet Take 1 tablet (500 mg total) by mouth 4 (four) times daily. (Patient not taking: Reported on 10/30/2014) 60 tablet 0  . oxyCODONE (ROXICODONE) 5 MG immediate release tablet Take 1 tablet (5 mg total) by mouth every 4 (four) hours as needed for severe pain. (Patient not taking: Reported on 10/30/2014) 100 tablet 0   No current facility-administered medications on file prior to visit.   BP 156/98 mmHg  Pulse 84  Temp(Src) 98.6 F (37 C) (Oral)  Resp 16  Ht 6' 0.5" (1.842 m)  Wt 241 lb (109.317 kg)  BMI 32.22 kg/m2      Objective:   Physical Exam  Constitutional: He appears well-developed and well-nourished.  Cardiovascular: Normal rate, regular rhythm and S1 normal.   Pulmonary/Chest: Effort normal and breath sounds normal.  Musculoskeletal:       Right lower leg: He exhibits swelling and edema (2+ Pitting Edema).       Left lower leg: He exhibits swelling and edema (2 + Pitting Edema).  Psychiatric: He has a normal mood and affect. Judgment normal.          Assessment & Plan:   1. Bilateral edema of lower extremity Improving since he discontinued Amlodipine.  Will start Maxzide to control hypertension.  2. Essential hypertension Worsening since stopping Amlodipine.  Will treat as above.  Will recheck in three months at his regular follow up visit.   - triamterene-hydrochlorothiazide (MAXZIDE-25) 37.5-25 MG per tablet; Take 1 tablet by mouth daily.  Dispense: 90 tablet; Refill: 3  Patient was seen and examined by Jerrell Belfast, MD, and note scribed by Ashley Royalty, CMA.   I have reviewed the document for accuracy and completeness and I agree with above. Jerrell Belfast, MD

## 2015-01-30 ENCOUNTER — Ambulatory Visit (INDEPENDENT_AMBULATORY_CARE_PROVIDER_SITE_OTHER): Payer: BLUE CROSS/BLUE SHIELD | Admitting: Family Medicine

## 2015-01-30 ENCOUNTER — Encounter: Payer: Self-pay | Admitting: Family Medicine

## 2015-01-30 VITALS — BP 140/84 | HR 84 | Temp 98.1°F | Resp 16 | Wt 237.0 lb

## 2015-01-30 DIAGNOSIS — IMO0002 Reserved for concepts with insufficient information to code with codable children: Secondary | ICD-10-CM

## 2015-01-30 DIAGNOSIS — D696 Thrombocytopenia, unspecified: Secondary | ICD-10-CM

## 2015-01-30 DIAGNOSIS — I10 Essential (primary) hypertension: Secondary | ICD-10-CM | POA: Diagnosis not present

## 2015-01-30 DIAGNOSIS — R739 Hyperglycemia, unspecified: Secondary | ICD-10-CM | POA: Diagnosis not present

## 2015-01-30 LAB — POCT GLYCOSYLATED HEMOGLOBIN (HGB A1C)
Est. average glucose Bld gHb Est-mCnc: 114
HEMOGLOBIN A1C: 5.6

## 2015-01-30 NOTE — Patient Instructions (Signed)
DASH Eating Plan °DASH stands for "Dietary Approaches to Stop Hypertension." The DASH eating plan is a healthy eating plan that has been shown to reduce high blood pressure (hypertension). Additional health benefits may include reducing the risk of type 2 diabetes mellitus, heart disease, and stroke. The DASH eating plan may also help with weight loss. °WHAT DO I NEED TO KNOW ABOUT THE DASH EATING PLAN? °For the DASH eating plan, you will follow these general guidelines: °· Choose foods with a percent daily value for sodium of less than 5% (as listed on the food label). °· Use salt-free seasonings or herbs instead of table salt or sea salt. °· Check with your health care provider or pharmacist before using salt substitutes. °· Eat lower-sodium products, often labeled as "lower sodium" or "no salt added." °· Eat fresh foods. °· Eat more vegetables, fruits, and low-fat dairy products. °· Choose whole grains. Look for the word "whole" as the first word in the ingredient list. °· Choose fish and skinless chicken or turkey more often than red meat. Limit fish, poultry, and meat to 6 oz (170 g) each day. °· Limit sweets, desserts, sugars, and sugary drinks. °· Choose heart-healthy fats. °· Limit cheese to 1 oz (28 g) per day. °· Eat more home-cooked food and less restaurant, buffet, and fast food. °· Limit fried foods. °· Cook foods using methods other than frying. °· Limit canned vegetables. If you do use them, rinse them well to decrease the sodium. °· When eating at a restaurant, ask that your food be prepared with less salt, or no salt if possible. °WHAT FOODS CAN I EAT? °Seek help from a dietitian for individual calorie needs. °Grains °Whole grain or whole wheat bread. Brown rice. Whole grain or whole wheat pasta. Quinoa, bulgur, and whole grain cereals. Low-sodium cereals. Corn or whole wheat flour tortillas. Whole grain cornbread. Whole grain crackers. Low-sodium crackers. °Vegetables °Fresh or frozen vegetables  (raw, steamed, roasted, or grilled). Low-sodium or reduced-sodium tomato and vegetable juices. Low-sodium or reduced-sodium tomato sauce and paste. Low-sodium or reduced-sodium canned vegetables.  °Fruits °All fresh, canned (in natural juice), or frozen fruits. °Meat and Other Protein Products °Ground beef (85% or leaner), grass-fed beef, or beef trimmed of fat. Skinless chicken or turkey. Ground chicken or turkey. Pork trimmed of fat. All fish and seafood. Eggs. Dried beans, peas, or lentils. Unsalted nuts and seeds. Unsalted canned beans. °Dairy °Low-fat dairy products, such as skim or 1% milk, 2% or reduced-fat cheeses, low-fat ricotta or cottage cheese, or plain low-fat yogurt. Low-sodium or reduced-sodium cheeses. °Fats and Oils °Tub margarines without trans fats. Light or reduced-fat mayonnaise and salad dressings (reduced sodium). Avocado. Safflower, olive, or canola oils. Natural peanut or almond butter. °Other °Unsalted popcorn and pretzels. °The items listed above may not be a complete list of recommended foods or beverages. Contact your dietitian for more options. °WHAT FOODS ARE NOT RECOMMENDED? °Grains °White bread. White pasta. White rice. Refined cornbread. Bagels and croissants. Crackers that contain trans fat. °Vegetables °Creamed or fried vegetables. Vegetables in a cheese sauce. Regular canned vegetables. Regular canned tomato sauce and paste. Regular tomato and vegetable juices. °Fruits °Dried fruits. Canned fruit in light or heavy syrup. Fruit juice. °Meat and Other Protein Products °Fatty cuts of meat. Ribs, chicken wings, bacon, sausage, bologna, salami, chitterlings, fatback, hot dogs, bratwurst, and packaged luncheon meats. Salted nuts and seeds. Canned beans with salt. °Dairy °Whole or 2% milk, cream, half-and-half, and cream cheese. Whole-fat or sweetened yogurt. Full-fat   cheeses or blue cheese. Nondairy creamers and whipped toppings. Processed cheese, cheese spreads, or cheese  curds. °Condiments °Onion and garlic salt, seasoned salt, table salt, and sea salt. Canned and packaged gravies. Worcestershire sauce. Tartar sauce. Barbecue sauce. Teriyaki sauce. Soy sauce, including reduced sodium. Steak sauce. Fish sauce. Oyster sauce. Cocktail sauce. Horseradish. Ketchup and mustard. Meat flavorings and tenderizers. Bouillon cubes. Hot sauce. Tabasco sauce. Marinades. Taco seasonings. Relishes. °Fats and Oils °Butter, stick margarine, lard, shortening, ghee, and bacon fat. Coconut, palm kernel, or palm oils. Regular salad dressings. °Other °Pickles and olives. Salted popcorn and pretzels. °The items listed above may not be a complete list of foods and beverages to avoid. Contact your dietitian for more information. °WHERE CAN I FIND MORE INFORMATION? °National Heart, Lung, and [employer] Institute: www.nhlbi.nih.gov/health/health-topics/topics/dash/ °Document Released: 05/05/2011 Document Revised: 09/30/2013 Document Reviewed: 03/20/2013 °ExitCare® Patient Information ©2015 [employer]. This information is not intended to replace advice given to you by your health care provider. Make sure you discuss any questions you have with your health care provider. ° °

## 2015-01-30 NOTE — Progress Notes (Signed)
Subjective:    Patient ID: Chad Matthews, male    DOB: [birth year], 54 y.o.   MRN: [id number]  Hyperglycemia Progression since onset: Last A1C 07/18/2014 was 6.1% Associated symptoms include arthralgias (when weather changes, secondary to hip replacement), diaphoresis (summer) and a visual change (eye exam NOT UTD). Pertinent negatives include no abdominal pain, anorexia, change in bowel habit, chest pain, chills, congestion, coughing, fatigue, fever, headaches, joint swelling, myalgias, nausea, neck pain, numbness, rash, sore throat, swollen glands, urinary symptoms, vertigo, vomiting or weakness.  Hypertension This is a chronic (Pt D/C Amlodipine secondary to BLE edema; started Maxzide at LOV) problem. Associated symptoms include anxiety (somebody at work quit). Pertinent negatives include no chest pain, headaches, malaise/fatigue, neck pain, orthopnea, palpitations, peripheral edema (Improved with Maxzide) or shortness of breath. There are no compliance problems.    Does not want to change his medication. Increased stress at work again.  Fellow worker has left.  Expects it to improve.   Has been eating watermelon. Has been trying to eat healthier. Has cut out soda pop. Walking some.  Still drinking beer.  Needs a colonoscopy.    Review of Systems  Constitutional: Positive for diaphoresis (summer). Negative for fever, chills, malaise/fatigue and fatigue.  HENT: Negative for congestion and sore throat.   Respiratory: Negative for cough and shortness of breath.   Cardiovascular: Negative for chest pain, palpitations and orthopnea.  Gastrointestinal: Negative for nausea, vomiting, abdominal pain, anorexia and change in bowel habit.  Musculoskeletal: Positive for arthralgias (when weather changes, secondary to hip replacement). Negative for myalgias, joint swelling and neck pain.  Skin: Negative for rash.  Neurological: Negative for vertigo, weakness, numbness and headaches.   BP 140/84  mmHg  Pulse 84  Temp(Src) 98.1 F (36.7 C) (Oral)  Resp 16  Wt 237 lb (107.502 kg)   Patient Active Problem List   Diagnosis Date Noted  . AA (alcohol abuse) 10/30/2014  . Accumulation of fluid in tissues 10/30/2014  . 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 Arthralgia of hip 10/30/2014  . History of tobacco use 10/30/2014  . Blood glucose elevated 10/30/2014  . LBP (low back pain) 10/30/2014  . Change in blood platelet count 10/30/2014  . Buzzing in ear 10/30/2014  . GERD (gastroesophageal reflux disease)   . Primary localized osteoarthritis of right hip 08/08/2014  . Pre-operative cardiovascular examination 07/18/2014  . Essential hypertension 07/18/2014   Past Medical History  Diagnosis Date  . Hypertension   . Hernia, hiatal   . GERD (gastroesophageal reflux disease)    Current Outpatient Prescriptions on File Prior to Visit  Medication Sig  . famotidine (PEPCID) 20 MG tablet Take 20 mg by mouth at bedtime.  . triamterene-hydrochlorothiazide (MAXZIDE-25) 37.5-25 MG per tablet Take 1 tablet by mouth daily.   No current facility-administered medications on file prior to visit.   Allergies  Allergen Reactions  . Bee Venom Anaphylaxis  . Lisinopril Cough   Past Surgical History  Procedure Laterality Date  . Bicep surgery Right   . Elbow surgery Left 1979  . Total hip arthroplasty Right 08/08/2014    Procedure: RIGHT TOTAL HIP ARTHROPLASTY;  Surgeon: Earlie Server, MD;  Location: [facility];  Service: Orthopedics;  Laterality: Right;  . Hernia repair  1969   Social History   Social History  . Marital Status: Married    Spouse Name: Hilda Blades  . Number of Children: N/A  . Years of Education: College   Occupational History  . Service Technician  Full-Time   Social History Main Topics  . Smoking status: Former Smoker -- 0.50 packs/day for 10 years    Types: Cigarettes    Quit date: 07/25/1984  . Smokeless tobacco: Never Used  . Alcohol Use: 8.4 oz/week    14 Cans of  beer per week     Comment: daily  . Drug Use: No  . Sexual Activity: Not on file   Other Topics Concern  . Not on file   Social History Narrative   Family History  Problem Relation Age of Onset  . Healthy Mother   . Cancer Father   . Stroke Father   . Transient ischemic attack Father   . Lung cancer Father   . Healthy Brother   . Healthy Brother       Objective:   Physical Exam  Constitutional: He is oriented to person, place, and time. He appears well-developed and well-nourished.  Neurological: He is alert and oriented to person, place, and time.  Psychiatric: He has a normal mood and affect. His behavior is normal. Judgment and thought content normal.   BP 140/84 mmHg  Pulse 84  Temp(Src) 98.1 F (36.7 C) (Oral)  Resp 16  Wt 237 lb (107.502 kg)        Assessment & Plan:  1. Essential hypertension Up today. Recommend decrease alcohol, dash diet, and call if worsens or does not improve.   - Comprehensive metabolic panel  2. Blood glucose elevated Looks good today. Continue current diet. Follow up for CPE.  - POCT glycosylated hemoglobin (Hb A1C) Results for orders placed or performed in visit on 01/30/15  POCT glycosylated hemoglobin (Hb A1C)  Result Value Ref Range   Hemoglobin A1C 5.6    Est. average glucose Bld gHb Est-mCnc 114     3. Change in blood platelet count Will check labs.  - CBC with Differential/Platelet Goals    . Reduce alcohol intake to 1 to 2 servings per day       Margarita Rana, MD

## 2015-01-31 LAB — CBC WITH DIFFERENTIAL/PLATELET
Basophils Absolute: 0 10*3/uL (ref 0.0–0.2)
Basos: 0 %
EOS (ABSOLUTE): 0.3 10*3/uL (ref 0.0–0.4)
Eos: 5 %
Hematocrit: 44.9 % (ref 37.5–51.0)
Hemoglobin: 15.4 g/dL (ref 12.6–17.7)
IMMATURE GRANS (ABS): 0 10*3/uL (ref 0.0–0.1)
IMMATURE GRANULOCYTES: 0 %
LYMPHS: 27 %
Lymphocytes Absolute: 1.4 10*3/uL (ref 0.7–3.1)
MCH: 33.5 pg — ABNORMAL HIGH (ref 26.6–33.0)
MCHC: 34.3 g/dL (ref 31.5–35.7)
MCV: 98 fL — AB (ref 79–97)
Monocytes Absolute: 0.4 10*3/uL (ref 0.1–0.9)
Monocytes: 8 %
NEUTROS ABS: 3 10*3/uL (ref 1.4–7.0)
NEUTROS PCT: 60 %
PLATELETS: 107 10*3/uL — AB (ref 150–379)
RBC: 4.6 x10E6/uL (ref 4.14–5.80)
RDW: 13 % (ref 12.3–15.4)
WBC: 5 10*3/uL (ref 3.4–10.8)

## 2015-01-31 LAB — COMPREHENSIVE METABOLIC PANEL
A/G RATIO: 1.6 (ref 1.1–2.5)
ALT: 25 IU/L (ref 0–44)
AST: 24 IU/L (ref 0–40)
Albumin: 4.5 g/dL (ref 3.5–5.5)
Alkaline Phosphatase: 104 IU/L (ref 39–117)
BUN/Creatinine Ratio: 12 (ref 9–20)
BUN: 12 mg/dL (ref 6–24)
Bilirubin Total: 0.8 mg/dL (ref 0.0–1.2)
CALCIUM: 9.7 mg/dL (ref 8.7–10.2)
CHLORIDE: 96 mmol/L — AB (ref 97–108)
CO2: 25 mmol/L (ref 18–29)
Creatinine, Ser: 1 mg/dL (ref 0.76–1.27)
GFR calc Af Amer: 98 mL/min/{1.73_m2} (ref >59)
GFR, EST NON AFRICAN AMERICAN: 84 mL/min/{1.73_m2} (ref >59)
Globulin, Total: 2.8 g/dL (ref 1.5–4.5)
Glucose: 120 mg/dL — ABNORMAL HIGH (ref 65–99)
POTASSIUM: 4.6 mmol/L (ref 3.5–5.2)
Sodium: 137 mmol/L (ref 134–144)
Total Protein: 7.3 g/dL (ref 6.0–8.5)

## 2015-02-03 ENCOUNTER — Telehealth: Payer: Self-pay

## 2015-02-03 NOTE — Telephone Encounter (Signed)
-----   Message from Margarita Rana, MD sent at 02/01/2015  8:52 PM EDT ----- Labs stable. Please notify patient. Thanks.

## 2015-02-03 NOTE — Telephone Encounter (Signed)
Pt advised.   Thanks,   -Dyllin Gulley  

## 2015-05-05 ENCOUNTER — Ambulatory Visit (INDEPENDENT_AMBULATORY_CARE_PROVIDER_SITE_OTHER): Payer: BLUE CROSS/BLUE SHIELD | Admitting: Family Medicine

## 2015-05-05 ENCOUNTER — Encounter: Payer: Self-pay | Admitting: Family Medicine

## 2015-05-05 VITALS — BP 142/88 | HR 78 | Temp 98.0°F | Resp 16 | Wt 233.6 lb

## 2015-05-05 DIAGNOSIS — S46911A Strain of unspecified muscle, fascia and tendon at shoulder and upper arm level, right arm, initial encounter: Secondary | ICD-10-CM | POA: Diagnosis not present

## 2015-05-05 NOTE — Patient Instructions (Signed)
Take two Aleve twice daily with food. Ice shoulder for 20 minutes after work.

## 2015-05-05 NOTE — Progress Notes (Signed)
Subjective:     Patient ID: Chad Matthews, male   DOB: Q000111Q, 54 y.o.   MRN: [id number]  HPI  Chief Complaint  Patient presents with  . Shoulder Pain    Patient comes in office today with concerns of pain in his right shoulder for the past 3 weeks, patient denies injury or over use of arm. Patient reports when he moves his shoulder up and down or rotating it he heres a "crackling" sound in his shoulder. Patient states that he has no difficulties with overhead activities  but if trying to reach around his back he has pain.   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 Uses his upper extremities every day but no specific  Incident where he hurt his shoulder. Reports it feels like it is better today. Has been applying Icy Hot topically.   Review of Systems  Musculoskeletal:       No prior right shoulder injury or surgery.       Objective:   Physical Exam  Constitutional: He appears well-developed and well-nourished. No distress.  Musculoskeletal:  Right shoulder strength is 5/5. FROM actively and passively. Mild tenderness over his anterior shoulder @his  coracoid area.       Assessment:    1. Shoulder strain, right, initial encounter    Plan:    Discussed scheduling nsaid's and icing after work. Consider x-ray if not resolving or recurrent.

## 2015-06-10 ENCOUNTER — Encounter: Payer: Self-pay | Admitting: Family Medicine

## 2015-06-10 ENCOUNTER — Ambulatory Visit (INDEPENDENT_AMBULATORY_CARE_PROVIDER_SITE_OTHER): Payer: BLUE CROSS/BLUE SHIELD | Admitting: Family Medicine

## 2015-06-10 VITALS — BP 138/88 | HR 76 | Temp 97.8°F | Resp 16 | Ht 73.0 in | Wt 235.0 lb

## 2015-06-10 DIAGNOSIS — Z0000 Encounter for general adult medical examination without abnormal findings: Secondary | ICD-10-CM | POA: Diagnosis not present

## 2015-06-10 DIAGNOSIS — R21 Rash and other nonspecific skin eruption: Secondary | ICD-10-CM

## 2015-06-10 DIAGNOSIS — S46911D Strain of unspecified muscle, fascia and tendon at shoulder and upper arm level, right arm, subsequent encounter: Secondary | ICD-10-CM

## 2015-06-10 DIAGNOSIS — Z1211 Encounter for screening for malignant neoplasm of colon: Secondary | ICD-10-CM | POA: Diagnosis not present

## 2015-06-10 NOTE — Patient Instructions (Signed)
Shoulder Range of Motion Exercises Shoulder range of motion (ROM) exercises are designed to keep the shoulder moving freely. They are often recommended for people who have shoulder pain. MOVEMENT EXERCISE When you are able, do this exercise 5-6 days per week, or as told by your health care provider. Work toward doing 2 sets of 10 swings. Pendulum Exercise How To Do This Exercise Lying Down 1. Lie face-down on a bed with your abdomen close to the side of the bed. 2. Let your arm hang over the side of the bed. 3. Relax your shoulder, arm, and hand. 4. Slowly and gently swing your arm forward and back. Do not use your neck muscles to swing your arm. They should be relaxed. If you are struggling to swing your arm, have someone gently swing it for you. When you do this exercise for the first time, swing your arm at a 15 degree angle for 15 seconds, or swing your arm 10 times. As pain lessens over time, increase the angle of the swing to 30-45 degrees. 5. Repeat steps 1-4 with the other arm. How To Do This Exercise While Standing 1. Stand next to a sturdy chair or table and hold on to it with your hand.  Bend forward at the waist.  Bend your knees slightly.  Relax your other arm and let it hang limp.  Relax the shoulder blade of the arm that is hanging and let it drop.  While keeping your shoulder relaxed, use body motion to swing your arm in small circles. The first time you do this exercise, swing your arm for about 30 seconds or 10 times. When you do it next time, swing your arm for a little longer.  Stand up tall and relax.  Repeat steps 1-7, this time changing the direction of the circles. 2. Repeat steps 1-8 with the other arm. STRETCHING EXERCISES Do these exercises 3-4 times per day on 5-6 days per week or as told by your health care provider. Work toward holding the stretch for 20 seconds. Stretching Exercise 1 1. Lift your arm straight out in front of you. 2. Bend your arm 90  degrees at the elbow (right angle) so your forearm goes across your body and looks like the letter "L." 3. Use your other arm to gently pull the elbow forward and across your body. 4. Repeat steps 1-3 with the other arm. Stretching Exercise 2 You will need a towel or rope for this exercise. 1. Bend one arm behind your back with the palm facing outward. 2. Hold a towel with your other hand. 3. Reach the arm that holds the towel above your head, and bend that arm at the elbow. Your wrist should be behind your neck. 4. Use your free hand to grab the free end of the towel. 5. With the higher hand, gently pull the towel up behind you. 6. With the lower hand, pull the towel down behind you. 7. Repeat steps 1-6 with the other arm. STRENGTHENING EXERCISES Do each of these exercises at four different times of day (sessions) every day or as told by your health care provider. To begin with, repeat each exercise 5 times (repetitions). Work toward doing 3 sets of 12 repetitions or as told by your health care provider. Strengthening Exercise 1 You will need a light weight for this activity. As you grow stronger, you may use a heavier weight. 1. Standing with a weight in your hand, lift your arm straight out to the side   until it is at the same height as your shoulder. 2. Bend your arm at 90 degrees so that your fingers are pointing to the ceiling. 3. Slowly raise your hand until your arm is straight up in the air. 4. Repeat steps 1-3 with the other arm. Strengthening Exercise 2 You will need a light weight for this activity. As you grow stronger, you may use a heavier weight. 1. Standing with a weight in your hand, gradually move your straight arm in an arc, starting at your side, then out in front of you, then straight up over your head. 2. Gradually move your other arm in an arc, starting at your side, then out in front of you, then straight up over your head. 3. Repeat steps 1-2 with the other  arm. Strengthening Exercise 3 You will need an elastic band for this activity. As you grow stronger, gradually increase the size of the bands or increase the number of bands that you use at one time. 1. While standing, hold an elastic band in one hand and raise that arm up in the air. 2. With your other hand, pull down the band until that hand is by your side. 3. Repeat steps 1-2 with the other arm.   This information is not intended to replace advice given to you by your health care provider. Make sure you discuss any questions you have with your health care provider.   Document Released: 02/12/2003 Document Revised: 09/30/2014 Document Reviewed: 05/12/2014 Elsevier Interactive Patient Education 2016 Elsevier Inc.  

## 2015-06-10 NOTE — Progress Notes (Signed)
Patient ID: [name], male   DOB: Q000111Q, 55 y.o.   MRN: [id number]       Patient: Chad Matthews, Male    DOB: Q000111Q, 55 y.o.   MRN: [id number] Visit Date: 06/10/2015  Today's Provider: Margarita Rana, MD   Chief Complaint  Patient presents with  . Annual Exam   Subjective:    Annual physical exam Chad Matthews is a 55 y.o. male who presents today for health maintenance and complete physical. He feels well. He reports exercising none, stays active with daily activites. He reports he is sleeping well.  11/04/13 CPE 10/29/04 Colon-polyp, internal hemorrhoids, recheck in 10 yrs  Lab Results  Component Value Date   WBC 5.0 01/30/2015   HGB 11.0* 08/10/2014   HCT 44.9 01/30/2015   PLT 107* 01/30/2015   GLUCOSE 120* 01/30/2015   CHOL 176 11/04/2013   TRIG 301* 11/04/2013   HDL 22* 11/04/2013   LDLCALC 94 11/04/2013   ALT 25 01/30/2015   AST 24 01/30/2015   NA 137 01/30/2015   K 4.6 01/30/2015   CL 96* 01/30/2015   CREATININE 1.00 01/30/2015   BUN 12 01/30/2015   CO2 25 01/30/2015   TSH 1.40 11/04/2013   INR 1.11 07/25/2014   HGBA1C 5.6 01/30/2015   ----------------------------------------------------------------- Rash: Patient complaining of rash on hands started 7-10 days ago. Patient reports he has noticed rash on left side of abdomen also. Patient reports rash is itchy and has used gold bond to help with symptoms. Patient reports that rash is improving.    Review of Systems  Constitutional: Negative.   HENT: Positive for tinnitus.   Eyes: Negative.   Respiratory: Negative.   Cardiovascular: Negative.   Gastrointestinal: Negative.   Endocrine: Negative.   Genitourinary: Negative.   Musculoskeletal: Positive for arthralgias.  Skin: Positive for rash.  Allergic/Immunologic: Negative.   Neurological: Negative.   Hematological: Negative.   Psychiatric/Behavioral: Negative.     Social History      He  reports that he quit smoking  about 30 years ago. His smoking use included Cigarettes. He has a 5 pack-year smoking history. He has never used smokeless tobacco. He reports that he drinks about 8.4 oz of alcohol per week. He reports that he does not use illicit drugs.       Social History   Social History  . Marital Status: Married    Spouse Name: Hilda Blades  . Number of Children: N/A  . Years of Education: College   Occupational History  . Service Technician     Full-Time   Social History Main Topics  . Smoking status: Former Smoker -- 0.50 packs/day for 10 years    Types: Cigarettes    Quit date: 07/25/1984  . Smokeless tobacco: Never Used  . Alcohol Use: 8.4 oz/week    14 Cans of beer per week     Comment: daily  . Drug Use: No  . Sexual Activity: Not Asked   Other Topics Concern  . None   Social History Narrative    Past Medical History  Diagnosis Date  . Hypertension   . Hernia, hiatal   . GERD (gastroesophageal reflux disease)      Patient Active Problem List   Diagnosis Date Noted  . AA (alcohol abuse) 10/30/2014  . Accumulation of fluid in tissues 10/30/2014  . 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 Arthralgia of hip 10/30/2014  . History of tobacco use 10/30/2014  . Blood glucose elevated 10/30/2014  .  LBP (low back pain) 10/30/2014  . Change in blood platelet count 10/30/2014  . Buzzing in ear 10/30/2014  . GERD (gastroesophageal reflux disease)   . Primary localized osteoarthritis of right hip 08/08/2014  . Pre-operative cardiovascular examination 07/18/2014  . Essential hypertension 07/18/2014    Past Surgical History  Procedure Laterality Date  . Bicep surgery Right   . Elbow surgery Left 1979  . Total hip arthroplasty Right 08/08/2014    Procedure: RIGHT TOTAL HIP ARTHROPLASTY;  Surgeon: Earlie Server, MD;  Location: [facility];  Service: Orthopedics;  Laterality: Right;  . Hernia repair  1969    Family History        Family Status  Relation Status Death Age  . Mother Alive   .  Father Deceased 13  . Brother Alive   . Brother Alive         His family history includes Cancer in his father; Healthy in his brother, brother, and mother; Lung cancer in his father; Stroke in his father; Transient ischemic attack in his father.    Allergies  Allergen Reactions  . Bee Venom Anaphylaxis  . Lisinopril Cough    Previous Medications   FAMOTIDINE (PEPCID) 20 MG TABLET    Take 20 mg by mouth at bedtime.   TRIAMTERENE-HYDROCHLOROTHIAZIDE (MAXZIDE-25) 37.5-25 MG PER TABLET    Take 1 tablet by mouth daily.    Patient Care Team: Margarita Rana, MD as PCP - General (Family Medicine)     Objective:   Vitals: BP 138/88 mmHg  Pulse 76  Temp(Src) 97.8 F (36.6 C) (Oral)  Resp 16  Ht 6\' 1"  (1.854 m)  Wt 235 lb (106.595 kg)  BMI 31.01 kg/m2  SpO2 99%   Physical Exam  Constitutional: He is oriented to person, place, and time. He appears well-developed and well-nourished.  HENT:  Right Ear: External ear normal.  Left Ear: External ear normal.  Nose: Nose normal.  Mouth/Throat: Oropharynx is clear and moist.  Eyes: Conjunctivae and EOM are normal. Pupils are equal, round, and reactive to light.  Neck: Normal range of motion. Neck supple.  Cardiovascular: Normal rate, regular rhythm and normal heart sounds.   Pulmonary/Chest: Effort normal and breath sounds normal.  Abdominal: Soft. Bowel sounds are normal.  Musculoskeletal: Normal range of motion.  Neurological: He is alert and oriented to person, place, and time. He has normal reflexes.  Skin: Skin is warm and dry. Rash noted.  Psychiatric: He has a normal mood and affect. His behavior is normal. Judgment and thought content normal.     Depression Screen PHQ 2/9 Scores 06/10/2015  PHQ - 2 Score 0      Assessment & Plan:     Routine Health Maintenance and Physical Exam  Exercise Activities and Dietary recommendations Goals    . Exercise 150 minutes per week (moderate activity)    . Reduce alcohol intake  to 1 to 2 servings per day       Immunization History  Administered Date(s) Administered  . Td 12/13/2011       1. Annual physical exam Stable. Patient advised to continue eating healthy and exercise daily.  2. Colon cancer screening - Ambulatory referral to Gastroenterology  3. Shoulder strain, right, subsequent encounter Not to goal. Patient advised to do shoulder exercises, printed out on his AVS.   4. Rash New problem. Patient advised to continue using OTC Gold Bond and unscented soap to help improve rash. Patient advised to call if rash is worsening or  not improving for a dermatology referral.      Patient seen and examined by Dr. Jerrell Belfast, and note scribed by Philbert Riser. Dimas, CMA.  I have reviewed the document for accuracy and completeness and I agree with above. Jerrell Belfast, MD   Margarita Rana, MD    --------------------------------------------------------------------

## 2015-08-22 IMAGING — CR DG HIP (WITH OR WITHOUT PELVIS) 1V PORT*R*
1 series · 1 of 1 positions shown · non-contrast
Comparison: Intraoperative imaging earlier today.

CLINICAL DATA: Status post right hip arthroplasty.

EXAM:
RIGHT HIP (WITH PELVIS) 1 VIEW PORTABLE

[lateral]
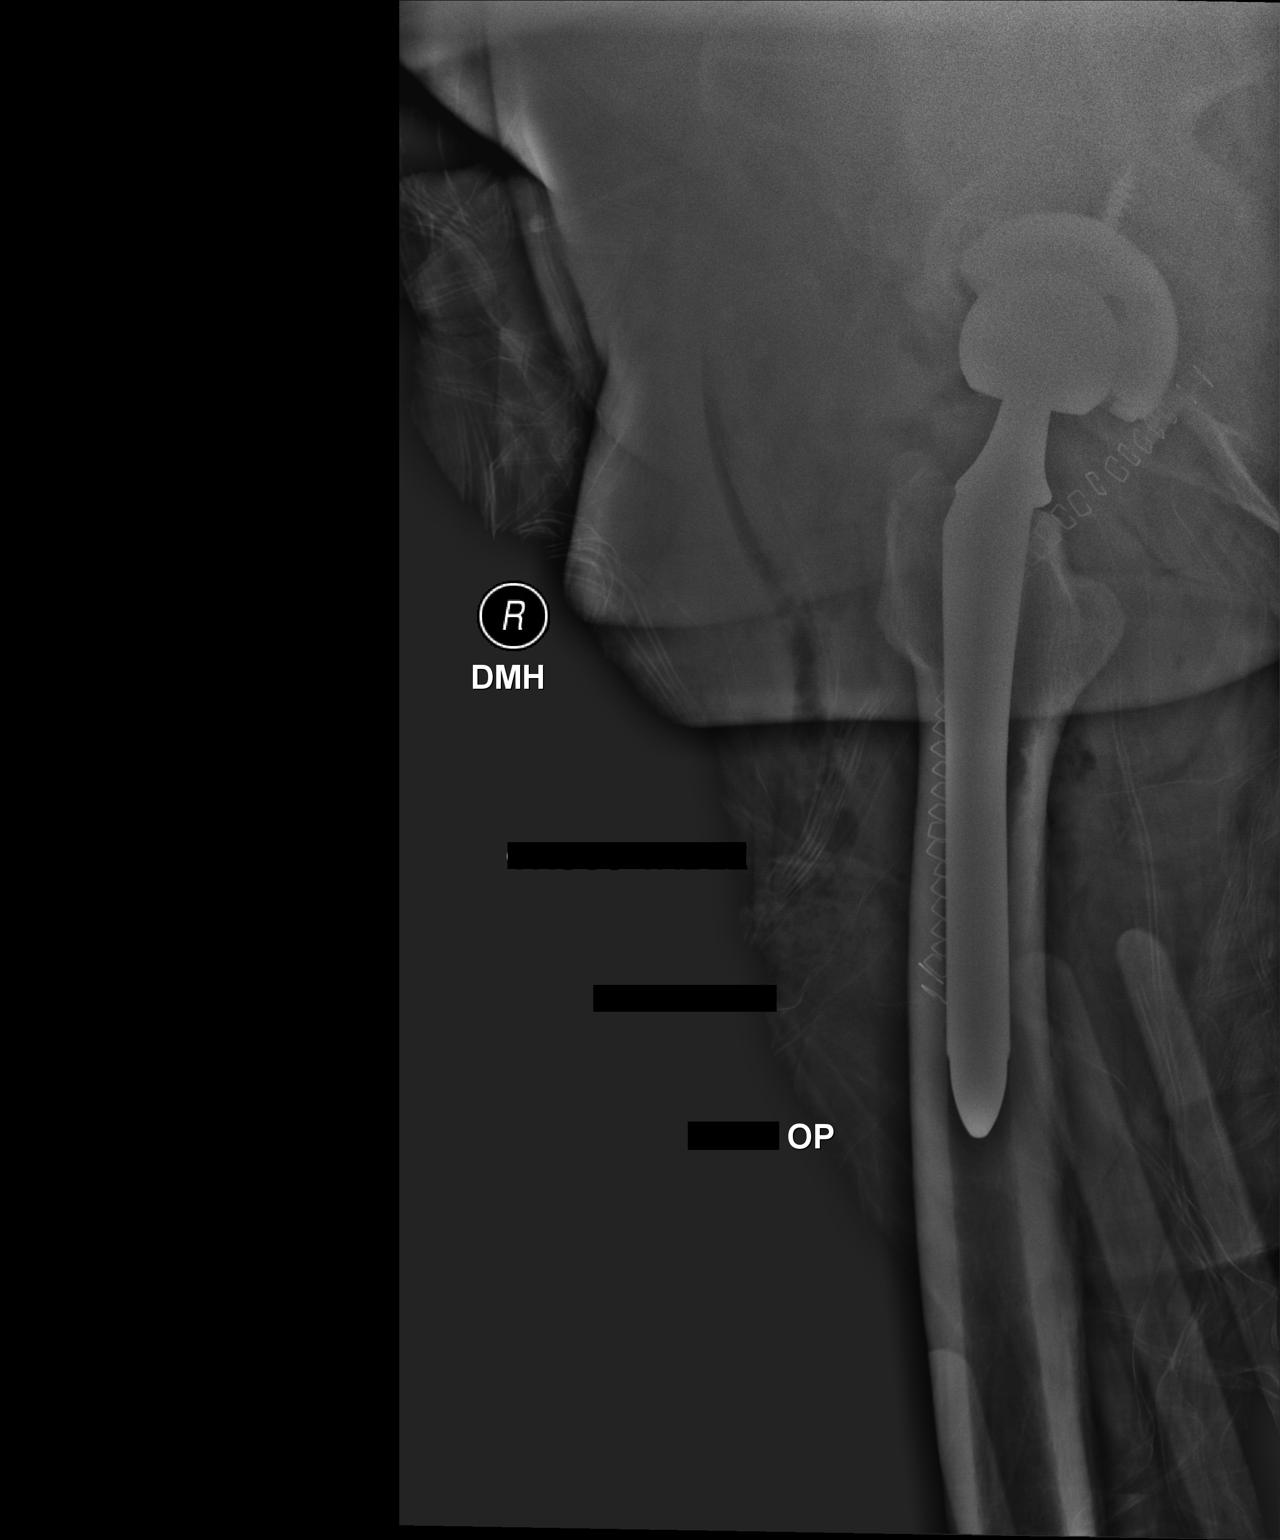

[1 of 1 positions shown; findings below may reference images not displayed]

FINDINGS: Post- operative cross table lateral film demonstrates normal lateral
alignment of the right hip arthroplasty. No fracture or abnormal
lucency identified.
IMPRESSION: Normal alignment of right hip arthroplasty.

## 2015-08-22 IMAGING — CR DG PORTABLE PELVIS
1 series · 1 of 1 positions shown · non-contrast
Comparison: Intraoperative imaging earlier today.

CLINICAL DATA: Status post right hip arthroplasty.

EXAM:
PORTABLE PELVIS 1-2 VIEWS

[AP]
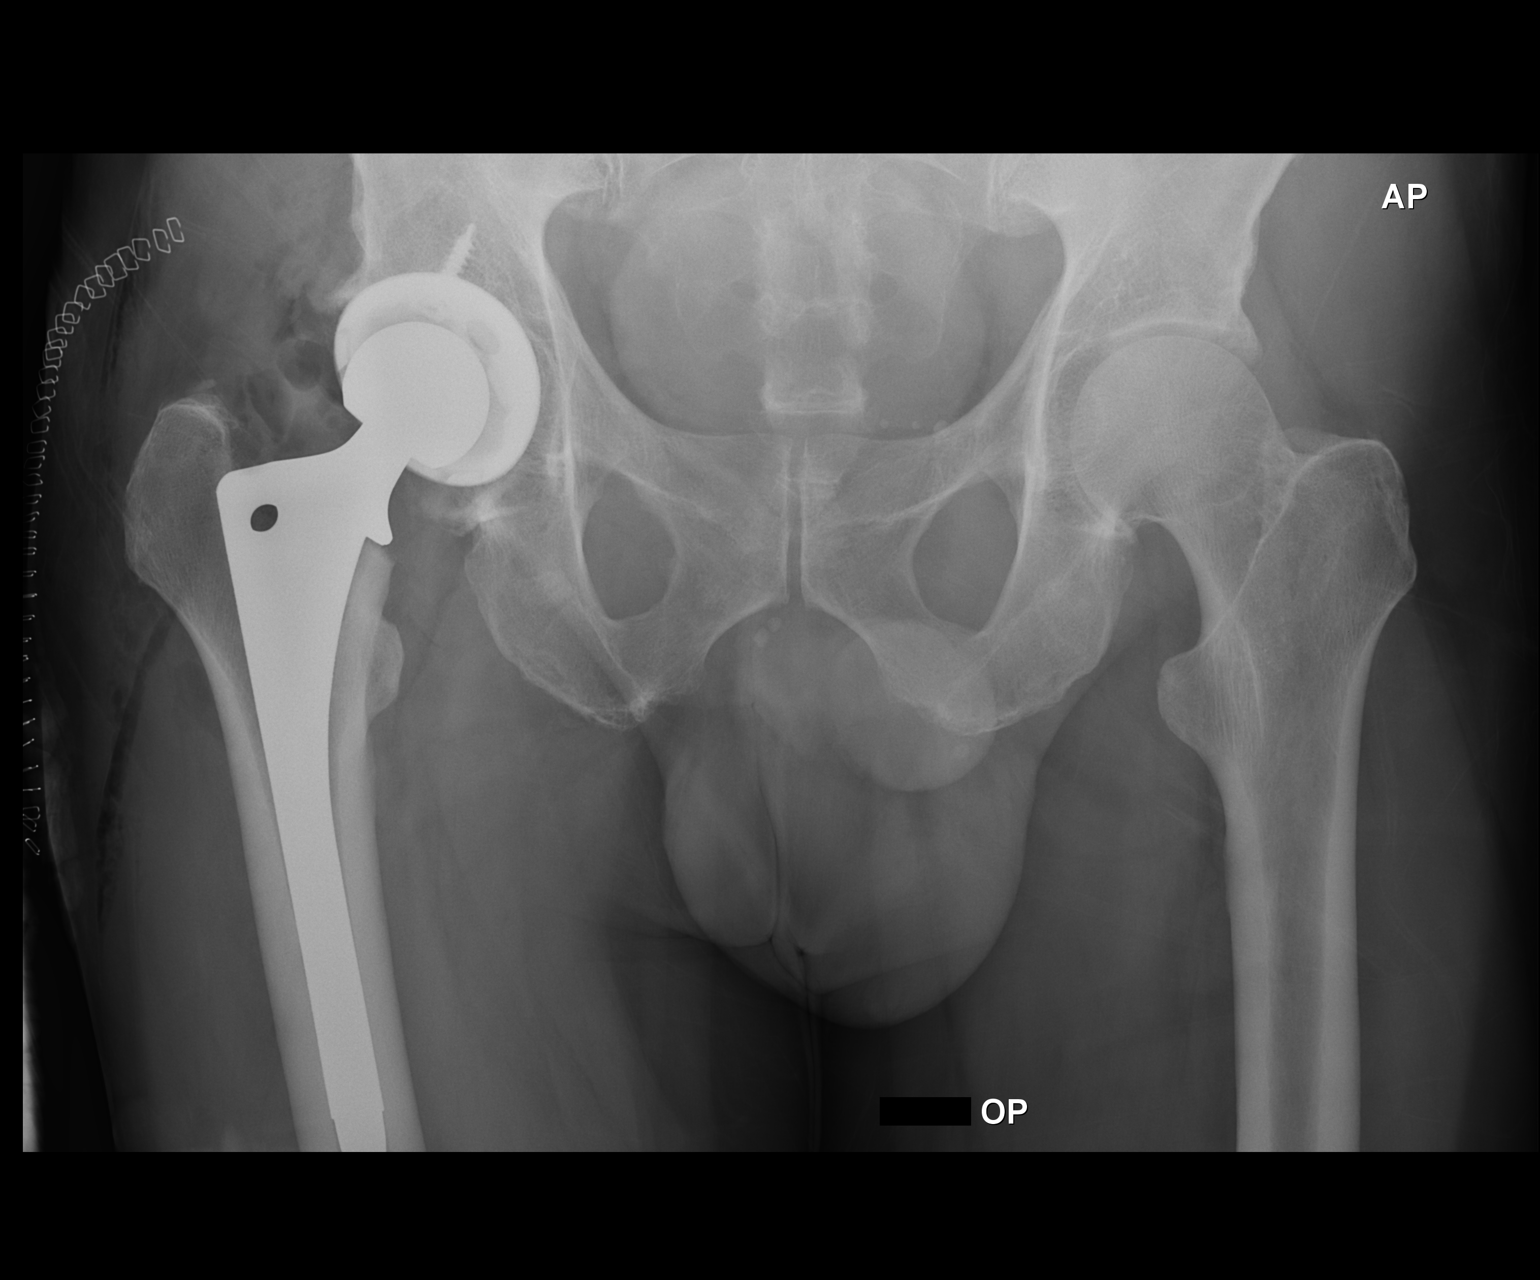

[1 of 1 positions shown; findings below may reference images not displayed]

FINDINGS: Frontal projection demonstrates normal alignment of a right hip
arthroplasty. The entire femoral stem is visualized. No fracture or
abnormal lucency is seen surrounding the prosthesis. The visualized
bony pelvis is unremarkable.
IMPRESSION: Normal radiographic appearance of right hip arthroplasty.

## 2015-08-26 ENCOUNTER — Encounter: Payer: Self-pay | Admitting: *Deleted

## 2015-08-27 ENCOUNTER — Ambulatory Visit: Payer: BLUE CROSS/BLUE SHIELD | Admitting: Anesthesiology

## 2015-08-27 ENCOUNTER — Encounter
Admission: RE | Disposition: A | Discharge status: Home / Self Care | Payer: Self-pay | Source: Ambulatory Visit | Attending: Unknown Physician Specialty

## 2015-08-27 ENCOUNTER — Ambulatory Visit
Admission: RE | Admit: 2015-08-27 | Discharge: 2015-08-27 | Disposition: A | Discharge status: Home / Self Care | Payer: BLUE CROSS/BLUE SHIELD | Source: Ambulatory Visit | Attending: Unknown Physician Specialty | Admitting: Unknown Physician Specialty

## 2015-08-27 ENCOUNTER — Encounter: Payer: Self-pay | Admitting: Anesthesiology

## 2015-08-27 DIAGNOSIS — Z91030 Bee allergy status: Secondary | ICD-10-CM | POA: Insufficient documentation

## 2015-08-27 DIAGNOSIS — Z96641 Presence of right artificial hip joint: Secondary | ICD-10-CM | POA: Insufficient documentation

## 2015-08-27 DIAGNOSIS — Z9889 Other specified postprocedural states: Secondary | ICD-10-CM | POA: Diagnosis not present

## 2015-08-27 DIAGNOSIS — Z888 Allergy status to other drugs, medicaments and biological substances status: Secondary | ICD-10-CM | POA: Insufficient documentation

## 2015-08-27 DIAGNOSIS — Z86010 Personal history of colonic polyps: Secondary | ICD-10-CM | POA: Insufficient documentation

## 2015-08-27 DIAGNOSIS — Z87891 Personal history of nicotine dependence: Secondary | ICD-10-CM | POA: Diagnosis not present

## 2015-08-27 DIAGNOSIS — D123 Benign neoplasm of transverse colon: Secondary | ICD-10-CM | POA: Insufficient documentation

## 2015-08-27 DIAGNOSIS — I10 Essential (primary) hypertension: Secondary | ICD-10-CM | POA: Diagnosis not present

## 2015-08-27 DIAGNOSIS — K219 Gastro-esophageal reflux disease without esophagitis: Secondary | ICD-10-CM | POA: Diagnosis not present

## 2015-08-27 DIAGNOSIS — M1990 Unspecified osteoarthritis, unspecified site: Secondary | ICD-10-CM | POA: Insufficient documentation

## 2015-08-27 DIAGNOSIS — K640 First degree hemorrhoids: Secondary | ICD-10-CM | POA: Diagnosis not present

## 2015-08-27 DIAGNOSIS — D122 Benign neoplasm of ascending colon: Secondary | ICD-10-CM | POA: Diagnosis not present

## 2015-08-27 DIAGNOSIS — Z1211 Encounter for screening for malignant neoplasm of colon: Secondary | ICD-10-CM | POA: Diagnosis not present

## 2015-08-27 DIAGNOSIS — Z79899 Other long term (current) drug therapy: Secondary | ICD-10-CM | POA: Diagnosis not present

## 2015-08-27 DIAGNOSIS — Z823 Family history of stroke: Secondary | ICD-10-CM | POA: Insufficient documentation

## 2015-08-27 DIAGNOSIS — Z801 Family history of malignant neoplasm of trachea, bronchus and lung: Secondary | ICD-10-CM | POA: Insufficient documentation

## 2015-08-27 DIAGNOSIS — K621 Rectal polyp: Secondary | ICD-10-CM | POA: Diagnosis not present

## 2015-08-27 HISTORY — PX: COLONOSCOPY WITH PROPOFOL: SHX5780

## 2015-08-27 SURGERY — COLONOSCOPY WITH PROPOFOL
Anesthesia: General

## 2015-08-27 MED ORDER — PHENYLEPHRINE HCL 10 MG/ML IJ SOLN
INTRAMUSCULAR | Status: DC | PRN
  Administered 2015-08-27: 25 ug via INTRAVENOUS

## 2015-08-27 MED ORDER — FENTANYL CITRATE (PF) 100 MCG/2ML IJ SOLN
INTRAMUSCULAR | Status: DC | PRN
  Administered 2015-08-27: 50 ug via INTRAVENOUS

## 2015-08-27 MED ORDER — SODIUM CHLORIDE 0.9 % IV SOLN
INTRAVENOUS | Status: DC
  Administered 2015-08-27: 13:00:00 via INTRAVENOUS

## 2015-08-27 MED ORDER — PROPOFOL 500 MG/50ML IV EMUL
INTRAVENOUS | Status: DC | PRN
  Administered 2015-08-27: 120 ug/kg/min via INTRAVENOUS

## 2015-08-27 MED ORDER — PIPERACILLIN-TAZOBACTAM 3.375 G IVPB 30 MIN
3.3750 g | Freq: Once | INTRAVENOUS | Status: AC
  Administered 2015-08-27: 3.375 g via INTRAVENOUS
  Filled 2015-08-27: qty 50

## 2015-08-27 MED ORDER — MIDAZOLAM HCL 2 MG/2ML IJ SOLN
INTRAMUSCULAR | Status: DC | PRN
  Administered 2015-08-27: 1 mg via INTRAVENOUS

## 2015-08-27 MED ORDER — PROPOFOL 500 MG/50ML IV EMUL: INTRAVENOUS | Status: DC | PRN

## 2015-08-27 NOTE — Transfer of Care (Signed)
Immediate Anesthesia Transfer of Care Note  Patient: Chad Matthews  Procedure(s) Performed: Procedure(s): COLONOSCOPY WITH PROPOFOL (N/A)  Patient Location: PACU  Anesthesia Type:General  Level of Consciousness: awake, alert , oriented and sedated  Airway & Oxygen Therapy: Patient Spontanous Breathing and Patient connected to nasal cannula oxygen  Post-op Assessment: Report given to RN and Post -op Vital signs reviewed and stable  Post vital signs: Reviewed and stable  Last Vitals:  Filed Vitals:   08/27/15 1430  Temp: [id number] C    Complications: No apparent anesthesia complications

## 2015-08-27 NOTE — Anesthesia Preprocedure Evaluation (Signed)
Anesthesia Evaluation  Patient identified by MRN, date of birth, ID band Patient awake    Reviewed: Allergy & Precautions, NPO status , Patient's Chart, lab work & pertinent test results  Airway Mallampati: II  TM Distance: >3 FB Neck ROM: Full    Dental  (+) Dental Advisory Given, Teeth Intact   Pulmonary former smoker,    Pulmonary exam normal breath sounds clear to auscultation       Cardiovascular hypertension, Pt. on medications  Rhythm:Regular Rate:Normal     Neuro/Psych negative neurological ROS  negative psych ROS   GI/Hepatic Neg liver ROS, hiatal hernia, GERD  Medicated and Controlled,  Endo/Other  negative endocrine ROS  Renal/GU negative Renal ROS     Musculoskeletal negative musculoskeletal ROS (+) Arthritis , Osteoarthritis,    Abdominal Normal abdominal exam  (+)   Peds negative pediatric ROS (+)  Hematology negative hematology ROS (+)   Anesthesia Other Findings   Reproductive/Obstetrics                             Anesthesia Physical  Anesthesia Plan  ASA: II  Anesthesia Plan: General   Post-op Pain Management:    Induction: Intravenous  Airway Management Planned: Nasal Cannula  Additional Equipment:   Intra-op Plan:   Post-operative Plan: Extubation in OR  Informed Consent: I have reviewed the patients History and Physical, chart, labs and discussed the procedure including the risks, benefits and alternatives for the proposed anesthesia with the patient or authorized representative who has indicated his/her understanding and acceptance.   Dental advisory given  Plan Discussed with: CRNA and Surgeon  Anesthesia Plan Comments:         Anesthesia Quick Evaluation

## 2015-08-27 NOTE — H&P (Signed)
   Primary Care Physician:  Margarita Rana, MD Primary Gastroenterologist:  Dr. Vira Agar  Pre-Procedure History & Physical: HPI:  Chad Matthews is a 55 y.o. male is here for an colonoscopy.   Past Medical History  Diagnosis Date  . Hypertension   . Hernia, hiatal   . GERD (gastroesophageal reflux disease)     Past Surgical History  Procedure Laterality Date  . Bicep surgery Right   . Elbow surgery Left 1979  . Total hip arthroplasty Right 08/08/2014    Procedure: RIGHT TOTAL HIP ARTHROPLASTY;  Surgeon: Earlie Server, MD;  Location: [facility];  Service: Orthopedics;  Laterality: Right;  . Hernia repair  1969    Prior to Admission medications   Medication Sig Start Date End Date Taking? Authorizing Provider  famotidine (PEPCID) 20 MG tablet Take 20 mg by mouth at bedtime.   Yes Historical Provider, MD  triamterene-hydrochlorothiazide (MAXZIDE-25) 37.5-25 MG per tablet Take 1 tablet by mouth daily. 10/30/14  Yes Margarita Rana, MD    Allergies as of 08/13/2015 - Review Complete 06/10/2015  Allergen Reaction Noted  . Bee venom Anaphylaxis 07/17/2014  . Lisinopril Cough 10/30/2014    Family History  Problem Relation Age of Onset  . Healthy Mother   . Cancer Father   . Stroke Father   . Transient ischemic attack Father   . Lung cancer Father   . Healthy Brother   . Healthy Brother     Social History   Social History  . Marital Status: Married    Spouse Name: Hilda Blades  . Number of Children: N/A  . Years of Education: College   Occupational History  . Service Technician     Full-Time   Social History Main Topics  . Smoking status: Former Smoker -- 0.50 packs/day for 10 years    Types: Cigarettes    Quit date: 07/25/1984  . Smokeless tobacco: Never Used  . Alcohol Use: 8.4 oz/week    14 Cans of beer per week     Comment: daily  . Drug Use: No  . Sexual Activity: Not on file   Other Topics Concern  . Not on file   Social History Narrative    Review of  Systems: See HPI, otherwise negative ROS  Physical Exam: BP 140/95 mmHg  Pulse 80  Temp(Src) 97.7 F (36.5 C) (Tympanic)  Resp 15  SpO2 100% General:   Alert,  pleasant and cooperative in NAD Head:  Normocephalic and atraumatic. Neck:  Supple; no masses or thyromegaly. Lungs:  Clear throughout to auscultation.    Heart:  Regular rate and rhythm. Abdomen:  Soft, nontender and nondistended. Normal bowel sounds, without guarding, and without rebound.   Neurologic:  Alert and  oriented x4;  grossly normal neurologically.  Impression/Plan: [name] is here for an colonoscopy to be performed for screening colonoscopy  Risks, benefits, limitations, and alternatives regarding  colonoscopy have been reviewed with the patient.  Questions have been answered.  All parties agreeable.   Gaylyn Cheers, MD  08/27/2015, 4:58 PM

## 2015-08-27 NOTE — Anesthesia Procedure Notes (Addendum)
Performed by: Vaughan Sine Pre-anesthesia Checklist: Patient identified, Emergency Drugs available, Suction available, Patient being monitored and Timeout performed Patient Re-evaluated:Patient Re-evaluated prior to inductionOxygen Delivery Method: Simple face mask Preoxygenation: Pre-oxygenation with 100% oxygen Intubation Type: IV induction Placement Confirmation: CO2 detector and positive ETCO2   Performed by: Donalda Ewings, Bryttney Netzer Ventilation: Oral airway inserted - appropriate to patient size

## 2015-08-27 NOTE — Op Note (Signed)
[facility] Gastroenterology Patient Name: Chad Matthews Procedure Date: 08/27/2015 1:37 PM MRN: [id number] Account #: [id number] Date of Birth: Mar 31, 1961 Admit Type: Outpatient Age: 55 Room: [facility] ENDO ROOM 4 Gender: Male Note Status: Finalized Procedure:            Colonoscopy Indications:          High risk colon cancer surveillance: Personal history                        of colonic polyps Providers:            Manya Silvas, MD Referring MD:         Jerrell Belfast, MD (Referring MD) Medicines:            Propofol per Anesthesia Complications:        No immediate complications. Procedure:            Pre-Anesthesia Assessment:                       - After reviewing the risks and benefits, the patient                        was deemed in satisfactory condition to undergo the                        procedure.                       After obtaining informed consent, the colonoscope was                        passed under direct vision. Throughout the procedure,                        the patient's blood pressure, pulse, and oxygen                        saturations were monitored continuously. The                        Colonoscope was introduced through the anus and                        advanced to the the cecum, identified by appendiceal                        orifice and ileocecal valve. The colonoscopy was                        performed without difficulty. The patient tolerated the                        procedure well. The quality of the bowel preparation                        was good. Findings:      A 10 mm polyp was found in the transverse colon. The polyp was sessile.       The polyp was removed with a hot snare. Resection and retrieval were       complete. To prevent bleeding after the polypectomy, two hemostatic  clips were successfully placed. There was no bleeding during, or at the       end, of the procedure.      A diminutive  polypoid lesion was found at the hepatic flexure. The       lesion was sessile. No bleeding was present. Biopsies were taken with a       cold forceps for histology.      Internal hemorrhoids were found during endoscopy. The hemorrhoids were       small and Grade I (internal hemorrhoids that do not prolapse).      The exam was otherwise without abnormality. Impression:           - One 10 mm polyp in the transverse colon, removed with                        a hot snare. Resected and retrieved. Clips were placed.                       - Benign polypoid lesion at the hepatic flexure.                        Biopsied.                       - Internal hemorrhoids.                       - The examination was otherwise normal. Recommendation:       - Await pathology results. Manya Silvas, MD 08/27/2015 2:28:20 PM This report has been signed electronically. Number of Addenda: 0 Note Initiated On: 08/27/2015 1:37 PM Scope Withdrawal Time: 0 hours 16 minutes 45 seconds  Total Procedure Duration: 0 hours 27 minutes 35 seconds       [facility]

## 2015-08-28 ENCOUNTER — Encounter: Payer: Self-pay | Admitting: Unknown Physician Specialty

## 2015-08-28 LAB — SURGICAL PATHOLOGY

## 2015-09-01 NOTE — Anesthesia Postprocedure Evaluation (Signed)
Anesthesia Post Note  Patient: Chad Matthews  Procedure(s) Performed: Procedure(s) (LRB): COLONOSCOPY WITH PROPOFOL (N/A)  Patient location during evaluation: PACU Anesthesia Type: General Level of consciousness: awake and alert and oriented Pain management: pain level controlled Vital Signs Assessment: post-procedure vital signs reviewed and stable Respiratory status: spontaneous breathing Cardiovascular status: blood pressure returned to baseline Anesthetic complications: no    Last Vitals:  Filed Vitals:   08/27/15 1450 08/27/15 1500  BP: 136/96 140/95  Pulse: 103 80  Temp:    Resp: 19 15    Last Pain:  Filed Vitals:   08/28/15 0746  PainSc: 0-No pain                 Chad Matthews

## 2015-10-22 ENCOUNTER — Other Ambulatory Visit: Payer: Self-pay | Admitting: Family Medicine

## 2015-10-22 DIAGNOSIS — I10 Essential (primary) hypertension: Secondary | ICD-10-CM

## 2016-04-11 ENCOUNTER — Other Ambulatory Visit: Payer: Self-pay | Admitting: Family Medicine

## 2016-04-11 DIAGNOSIS — I10 Essential (primary) hypertension: Secondary | ICD-10-CM

## 2016-06-01 ENCOUNTER — Ambulatory Visit (INDEPENDENT_AMBULATORY_CARE_PROVIDER_SITE_OTHER): Payer: Self-pay

## 2016-06-01 ENCOUNTER — Ambulatory Visit
Admission: EM | Admit: 2016-06-01 | Discharge: 2016-06-01 | Disposition: A | Discharge status: Home / Self Care | Payer: Self-pay | Attending: Family Medicine | Admitting: Family Medicine

## 2016-06-01 DIAGNOSIS — S61211A Laceration without foreign body of left index finger without damage to nail, initial encounter: Secondary | ICD-10-CM

## 2016-06-01 MED ORDER — TETANUS-DIPHTH-ACELL PERTUSSIS 5-2.5-18.5 LF-MCG/0.5 IM SUSP
0.5000 mL | Freq: Once | INTRAMUSCULAR | Status: AC
  Administered 2016-06-01: 0.5 mL via INTRAMUSCULAR

## 2016-06-01 MED ORDER — LIDOCAINE HCL (PF) 1 % IJ SOLN
15.0000 mL | Freq: Once | INTRAMUSCULAR | Status: DC
Start: 2016-06-01 — End: 2016-06-01

## 2016-06-01 MED ORDER — LIDOCAINE-EPINEPHRINE-TETRACAINE (LET) SOLUTION
3.0000 mL | Freq: Once | NASAL | Status: AC
  Administered 2016-06-01: 3 mL via TOPICAL

## 2016-06-01 NOTE — ED Triage Notes (Signed)
Worker's comp. Works at [employer] and cut left index finger with bread slicer, multiple cuts. Actively bleeding in triage. Pain 1/10

## 2016-06-01 NOTE — Discharge Instructions (Signed)
Keep clean and dry. Wear finger splint.   Return to Urgent care in 7-10 days for suture removal.   Follow up with Sherman FNP for any redness, pain, drainage, decreased range of motion or concerns as discussed.  Follow up with your primary care physician this week as needed. Return to Urgent care for new or worsening concerns.

## 2016-06-01 NOTE — ED Provider Notes (Signed)
[facility] ____________________________________________  Time seen: Approximately 4:15 PM  I have reviewed the triage vital signs and the nursing notes.   HISTORY  Chief Complaint Extremity Laceration   HPI Chad Matthews is a 56 y.o. male presents for complaints of left hand laceration sustained just prior to arrival while at work. 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 Denies other injury or pain. States mild pain at laceration site. Unsure of last tetanus immunization. Reports right-handed. Denies paresthesias, decreased range of motion or decreased hand strength since injury. Denies any other pain or complaints.Reports the blade was new and clean. Denies concerns of foreign bodies.   Denies chest pain, shortness of breath, vision changes, dizziness or headache. Denies head injury or loss of consciousness.   Lelon Huh, MD: PCP   Past Medical History:  Diagnosis Date  . GERD (gastroesophageal reflux disease)   . Hernia, hiatal   . Hypertension     Patient Active Problem List   Diagnosis Date Noted  . AA (alcohol abuse) 10/30/2014  . Accumulation of fluid in tissues 10/30/2014  . 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 Arthralgia of hip 10/30/2014  . History of tobacco use 10/30/2014  . Blood glucose elevated 10/30/2014  . LBP (low back pain) 10/30/2014  . Change in blood platelet count 10/30/2014  . Buzzing in ear 10/30/2014  . GERD (gastroesophageal reflux disease)   . Primary localized osteoarthritis of right hip 08/08/2014  . Pre-operative cardiovascular examination 07/18/2014  . Essential hypertension 07/18/2014    Past Surgical History:  Procedure Laterality Date  . bicep surgery Right   . COLONOSCOPY WITH PROPOFOL N/A 08/27/2015   Procedure: COLONOSCOPY WITH PROPOFOL;  Surgeon: Manya Silvas, MD;  Location:  [facility] ENDOSCOPY;  Service: Endoscopy;  Laterality: N/A;  . ELBOW SURGERY Left 1979  . HERNIA REPAIR  1969  . TOTAL HIP ARTHROPLASTY Right 08/08/2014   Procedure: RIGHT TOTAL HIP ARTHROPLASTY;  Surgeon: Earlie Server, MD;  Location: [facility];  Service: Orthopedics;  Laterality: Right;    Current Outpatient Rx  . Order #: [id number] Class: Historical Med  . Order #: [id number] Class: Normal    No current facility-administered medications for this encounter.   Current Outpatient Prescriptions:  .  famotidine (PEPCID) 20 MG tablet, Take 20 mg by mouth at bedtime., Disp: , Rfl:  .  triamterene-hydrochlorothiazide (MAXZIDE-25) 37.5-25 MG tablet, Take 1 tablet by mouth daily., Disp: 90 tablet, Rfl: 3  Allergies Bee venom and Lisinopril  Family History  Problem Relation Age of Onset  . Healthy Mother   . Cancer Father   . Stroke Father   . Transient ischemic attack Father   . Lung cancer Father   . Healthy Brother   . Healthy Brother     Social History Social History  Substance Use Topics  . Smoking status: Former Smoker    Packs/day: 0.50    Years: 10.00    Types: Cigarettes    Quit date: 07/25/1984  . Smokeless tobacco: Never Used  . Alcohol use 8.4 oz/week    14 Cans of beer per week     Comment: daily    Review of Systems Constitutional: No fever/chills Eyes: No visual changes. ENT: No sore throat. Cardiovascular: Denies chest pain. Respiratory: Denies shortness of breath. Gastrointestinal: No abdominal pain.  No nausea, no vomiting.  No diarrhea.  No constipation. Genitourinary: Negative for dysuria. Musculoskeletal:  Negative for back pain. Skin: Negative for rash. As above.  Neurological: Negative for headaches, focal weakness or numbness.  10-point ROS otherwise negative.  ____________________________________________   PHYSICAL EXAM:  VITAL SIGNS: ED Triage Vitals  Enc Vitals Group     BP 06/01/16 1553 (!) 184/113     Pulse Rate 06/01/16 1553 (!)  115 Recheck 88     Resp 06/01/16 1553 18     Temp 06/01/16 1553 97.7 F (36.5 C)     Temp Source 06/01/16 1553 Oral     SpO2 06/01/16 1553 99 %     Weight 06/01/16 1549 235 lb (106.6 kg)     Height 06/01/16 1549 6' (1.829 m)     Head Circumference --      Peak Flow --      Pain Score 06/01/16 1551 1     Pain Loc --      Pain Edu? --      Excl. in [place]? --     Constitutional: Alert and oriented. Well appearing and in no acute distress. Eyes: Conjunctivae are normal. PERRL. EOMI. ENT      Head: Normocephalic and atraumatic.      Mouth/Throat: Mucous membranes are moist. Cardiovascular: Normal rate, regular rhythm. Grossly normal heart sounds.  Good peripheral circulation. Respiratory: Normal respiratory effort without tachypnea nor retractions. Breath sounds are clear and equal bilaterally. No wheezes/rales/rhonchi.. Musculoskeletal:  Ambulatory with steady gait. See skin below. Neurologic:  Normal speech and language. Speech is normal. No gait instability.  Skin:  Skin is warm, dry and intact. No rash noted. Except: 6 linear horizontal lacerations present to the dorsal aspect of right hand second index finger starting just proximal of the MCP joint extending to second phalanx, with varying sizes of lacerations present. Approximately 2 distally of second index finger laceration sizes in order are approximately 1.5 cm superficial and well approximated, 3.5 cm bleeding laceration, 1.5 cm minimally gaping laceration, 1 cm superficial well approximated laceration, 2.5 cm mildly bleeding laceration, 1.5 cm laceration. Minimal tenderness around laceration site. Left index finger with full range of motion, normal distal sensation and capillary refill with good straight with resisted flexion and extension of index finger, left hand otherwise nontender; No motor or tendon deficits noted. Left hand distal fingers with normal sensation and capillary refill time. No foreign bodies visualized. Bilateral  hand grips strong and equal. Bilateral distal radial pulses strong and equal. Psychiatric: Mood and affect are normal. Speech and behavior are normal. Patient exhibits appropriate insight and judgment   ___________________________________________   LABS (all labs ordered are listed, but only abnormal results are displayed)  Labs Reviewed - No data to display  RADIOLOGY  Dg Finger Index Left  Result Date: 06/01/2016 CLINICAL DATA:  Dorsal laceration of the index finger. EXAM: LEFT INDEX FINGER 2+V COMPARISON:  None. FINDINGS: No opaque foreign body or acute fracture in the index finger. The index finger MCP and interphalangeal joints are osteoarthritic with spurring and joint narrowing. Prominent advanced first Atkinson osteoarthritis. IMPRESSION: No opaque foreign body or fracture of the index finger. Osteoarthritis. Electronically Signed   By: Monte Fantasia M.D.   On: 06/01/2016 16:35   ____________________________________________   PROCEDURES Procedures   Procedure(s) performed:  Procedure explained and verbal consent obtained. Consent: Verbal consent obtained. Written consent not obtained. Risks and benefits: risks, benefits and alternatives were discussed Patient identity confirmed: verbally with patient and hospital-assigned identification number  Consent given by: patient   Laceration Repair Location: left  hand and left second digit. Length: 3.5 cm; 1.5 cm; 2.5cm, 1.5 cm Foreign bodies: no foreign bodies Tendon involvement: none Nerve involvement: none Preparation: Patient was prepped and draped in the usual sterile fashion. Anesthesia with 1% Lidocaine 6 mls, topical LET briefly applied to laceration present at left dorsal MCP Irrigation solution: saline and betadine Irrigation method: jet lavage Amount of cleaning: copious Repaired with 4-0 nylon  Number of sutures: 3.5 cm: 5 sutures; 1.5 cm; 1 suture; 2.5cm: 3 sutures; 1.5 cm: 2 sutures. 11 total sutures. Technique:  simple interrupted  Approximation: loose Patient tolerate well. Wound well approximated post repair.  Antibiotic ointment and dressing applied. Finger splint applied to left second finger. Wound care instructions provided.  Observe for any signs of infection or other problems.      INITIAL IMPRESSION / ASSESSMENT AND PLAN / ED COURSE  Pertinent labs & imaging results that were available during my care of the patient were reviewed by me and considered in my medical decision making (see chart for details).  Well-appearing patient. No acute distress. Presents for the complaints of left hand second finger laceration post mechanical injury at work. Tetanus immunization updated. 4 of the 6 lacerations repaired. Left hand x-ray per radiologist negative for acute bony abnormality or foreign body. No tender motor deficit noted. Discussed wound care. Dressing applied and finger splint applied. Discussed the patient may return to work but keep laceration sites clean, dry and in splint for sutures integrity. Return to urgent care in 7-10 days for suture removal. Discussed any concerns of decreased strength, redness drainage or worsening concerns follow up with Jane Lew FNP.  Blood pressure is noted to be elevated today, but patient reports normal recently. Patient present he did take his blood pressure medication today. Patient states he feels like his blood pressure is elevated due to stress of laceration. Counseled regarding elevated blood pressure, encourage patient to take medication as prescribed as well as monitor at home and follow up with primary care physician. Patient denies any other complaints or symptoms outside of laceration present today.  Discussed follow up with Primary care physician this week. Discussed follow up and return parameters including no resolution or any worsening concerns. Patient verbalized understanding and agreed to plan.    ____________________________________________   FINAL CLINICAL IMPRESSION(S) / ED DIAGNOSES  Final diagnoses:  Laceration of left index finger without foreign body without damage to nail, initial encounter     Discharge Medication List as of 06/01/2016  5:28 PM      Note: This dictation was prepared with Dragon dictation along with smaller phrase technology. Any transcriptional errors that result from this process are unintentional.    Clinical Course       Marylene Land, NP 06/01/16 2132

## 2016-06-09 ENCOUNTER — Ambulatory Visit
Admission: EM | Admit: 2016-06-09 | Discharge: 2016-06-09 | Disposition: A | Discharge status: Home / Self Care | Payer: Self-pay

## 2016-06-09 DIAGNOSIS — Z4802 Encounter for removal of sutures: Secondary | ICD-10-CM

## 2016-06-09 NOTE — ED Provider Notes (Signed)
[facility] ____________________________________________  Time seen: Approximately 5:26 PM  I have reviewed the triage vital signs and the nursing notes.   HISTORY  Chief Complaint Suture / Staple Removal  HPI Chad Matthews is a 56 y.o. male present for suture removal. Patient reports that 8 days ago he was seen in the urgent care after having laceration sustained to left second digit due to an accidental injury hitting a bread slicer. Patient reports area has been healing well. Reports has been keeping clean and applying topical antibiotics. Reports laceration appears to be healing well. Denies fevers, trauma, decreased sensation, numbness, decreased range of motion, or redness. Reports feels to be healing well without any complications. Reports his tetanus immunization is up-to-date. 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 Patient denies other complaints, and reports feels well.    Past Medical History:  Diagnosis Date  . GERD (gastroesophageal reflux disease)   . Hernia, hiatal   . Hypertension     Patient Active Problem List   Diagnosis Date Noted  . AA (alcohol abuse) 10/30/2014  . Accumulation of fluid in tissues 10/30/2014  . 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 Arthralgia of hip 10/30/2014  . History of tobacco use 10/30/2014  . Blood glucose elevated 10/30/2014  . LBP (low back pain) 10/30/2014  . Change in blood platelet count 10/30/2014  . Buzzing in ear 10/30/2014  . GERD (gastroesophageal reflux disease)   . Primary localized osteoarthritis of right hip 08/08/2014  . Pre-operative cardiovascular examination 07/18/2014  . Essential hypertension 07/18/2014    Past Surgical History:  Procedure Laterality Date  . bicep surgery Right   . COLONOSCOPY WITH PROPOFOL N/A 08/27/2015   Procedure: COLONOSCOPY WITH PROPOFOL;  Surgeon: Manya Silvas, MD;  Location: [facility] ENDOSCOPY;  Service: Endoscopy;  Laterality: N/A;  . ELBOW SURGERY Left 1979    . HERNIA REPAIR  1969  . TOTAL HIP ARTHROPLASTY Right 08/08/2014   Procedure: RIGHT TOTAL HIP ARTHROPLASTY;  Surgeon: Earlie Server, MD;  Location: [facility];  Service: Orthopedics;  Laterality: Right;    Current Outpatient Rx  . Order #: [id number] Class: Historical Med  . Order #: [id number] Class: Normal    No current facility-administered medications for this encounter.   Current Outpatient Prescriptions:  .  famotidine (PEPCID) 20 MG tablet, Take 20 mg by mouth at bedtime., Disp: , Rfl:  .  triamterene-hydrochlorothiazide (MAXZIDE-25) 37.5-25 MG tablet, Take 1 tablet by mouth daily., Disp: 90 tablet, Rfl: 3  Allergies Bee venom and Lisinopril  Family History  Problem Relation Age of Onset  . Healthy Mother   . Cancer Father   . Stroke Father   . Transient ischemic attack Father   . Lung cancer Father   . Healthy Brother   . Healthy Brother     Social History Social History  Substance Use Topics  . Smoking status: Former Smoker    Packs/day: 0.50    Years: 10.00    Types: Cigarettes    Quit date: 07/25/1984  . Smokeless tobacco: Never Used  . Alcohol use 8.4 oz/week    14 Cans of beer per week     Comment: daily    Review of Systems Constitutional: No fever/chills Cardiovascular: Denies chest pain. Respiratory: Denies shortness of breath. Musculoskeletal: Negative for back pain or extremity pain.  Skin: Negative for rash. As above.  Neurological: Negative for headaches, focal weakness or numbness.  10-point ROS otherwise negative.  ____________________________________________   PHYSICAL EXAM:  VITAL SIGNS: ED Triage Vitals  Enc Vitals Group     BP 06/09/16 1715 (!) 167/105     Pulse Rate 06/09/16 1715 92     Resp 06/09/16 1715 16     Temp 06/09/16 1715 98.6 F (37 C)     Temp Source 06/09/16 1715 Oral     SpO2 06/09/16 1715 99 %     Weight 06/09/16 1717 235 lb (106.6 kg)     Height 06/09/16 1717 6' (1.829 m)     Head Circumference --      Peak Flow  --      Pain Score 06/09/16 1717 0     Pain Loc --      Pain Edu? --      Excl. in [place]? --     Constitutional: Alert and oriented. Well appearing and in no acute distress. Eyes: Conjunctivae are normal. PERRL. EOMI. ENT      Head: Normocephalic and atraumatic. Neurologic:  Normal speech and language. Speech is normal. No gait instability.  Skin:  Left second digit lacerations appear well approximated. Sutures 11 removed by RN prior to examination. 6 total lacerations present to left distal second metacarpal and second digit, 4 of which had sutures. Lacerations well approximated, no dehiscence noted, no exudate, no drainage, no surrounding erythema, no noted swelling. Left hand second digit with full range of motion, no motor or tendon deficit appreciated, normal distal sensation and capillary refill. Bilateral hand grips strong and equal. Patient able to thumb touch to each fingertip left hand without any discomfort or difficulty per patient. Psychiatric: Mood and affect are normal. Speech and behavior are normal. Patient exhibits appropriate insight and judgment   ___________________________________________   LABS (all labs ordered are listed, but only abnormal results are displayed)  Labs Reviewed - No data to display  PROCEDURES Procedures    INITIAL IMPRESSION / ASSESSMENT AND PLAN / ED COURSE  Pertinent labs & imaging results that were available during my care of the patient were reviewed by me and considered in my medical decision making (see chart for details).  Well-appearing patient. No acute distress. Presenting for Worker's Compensation suture removal. Patient reports laceration appeared to be healing well. Lacerations well approximated. Sutures removed by RN. Discussed the patient may return to work as of no restrictions. Counselor Elta Guadeloupe continued to monitor her wounds and follow-up with Tommy and more for any concerns.  Patient elevated blood pressure also noted in  urgent care. Patient at last visit noted to have elevated blood pressure. Patient with chronic hypertension, and patient reports he has been following with his primary care physician regarding blood pressure and states that he feels his blood pressures is currently elevated due to stress. Counseled follow-up with primary care physician for continued monitoring and evaluation. Patient agrees.   Discussed follow up with Primary care physician this week. Discussed follow up and return parameters including no resolution or any worsening concerns. Patient verbalized understanding and agreed to plan.   ____________________________________________   FINAL CLINICAL IMPRESSION(S) / ED DIAGNOSES  Final diagnoses:  Visit for suture removal     Discharge Medication List as of 06/09/2016  5:27 PM      Note: This dictation was prepared with Dragon dictation along with smaller phrase technology. Any transcriptional errors that result from this process are unintentional.    Clinical Course       Marylene Land, NP 06/09/16 1814

## 2016-06-09 NOTE — ED Triage Notes (Signed)
Left index finger suture removal. Worker's Compensation

## 2016-06-09 NOTE — ED Notes (Signed)
11 sutures removed. Edges to wounds approximated with secondary intention healing noted. Mild redness around wound sites but pt reports he switched to Neosporin. No sx infection

## 2016-12-19 ENCOUNTER — Ambulatory Visit (INDEPENDENT_AMBULATORY_CARE_PROVIDER_SITE_OTHER): Payer: BLUE CROSS/BLUE SHIELD | Admitting: Physician Assistant

## 2016-12-19 ENCOUNTER — Encounter: Payer: Self-pay | Admitting: Physician Assistant

## 2016-12-19 VITALS — BP 148/96 | HR 72 | Temp 98.2°F | Resp 16 | Wt 225.0 lb

## 2016-12-19 DIAGNOSIS — I10 Essential (primary) hypertension: Secondary | ICD-10-CM

## 2016-12-19 DIAGNOSIS — Z125 Encounter for screening for malignant neoplasm of prostate: Secondary | ICD-10-CM

## 2016-12-19 DIAGNOSIS — Z0000 Encounter for general adult medical examination without abnormal findings: Secondary | ICD-10-CM | POA: Diagnosis not present

## 2016-12-19 DIAGNOSIS — Z13220 Encounter for screening for lipoid disorders: Secondary | ICD-10-CM

## 2016-12-19 DIAGNOSIS — R739 Hyperglycemia, unspecified: Secondary | ICD-10-CM

## 2016-12-19 DIAGNOSIS — K219 Gastro-esophageal reflux disease without esophagitis: Secondary | ICD-10-CM

## 2016-12-19 DIAGNOSIS — Z136 Encounter for screening for cardiovascular disorders: Secondary | ICD-10-CM

## 2016-12-19 DIAGNOSIS — Z87891 Personal history of nicotine dependence: Secondary | ICD-10-CM | POA: Diagnosis not present

## 2016-12-19 MED ORDER — TRIAMTERENE-HCTZ 37.5-25 MG PO TABS: 1.0000 | ORAL_TABLET | Freq: Two times a day (BID) | ORAL | 1 refills | Status: DC

## 2016-12-19 NOTE — Patient Instructions (Signed)

## 2016-12-19 NOTE — Progress Notes (Signed)
Patient: Chad Matthews, Male    DOB: [birth year], 56 y.o.   MRN: [id number] Visit Date: 12/19/2016  Today's Provider: Mar Daring, PA-C   Chief Complaint  Patient presents with  . Annual Exam   Subjective:    Annual physical exam Chad Matthews is a 56 y.o. male who presents today for health maintenance and complete physical. He feels fairly well. He reports not exercising as much. He reports he is sleeping fairly well. -----------------------------------------------------------------   Review of Systems  Constitutional: Positive for diaphoresis (Night sweats.). Negative for activity change, appetite change, chills, fatigue, fever and unexpected weight change.  HENT: Negative.   Eyes: Negative.   Respiratory: Negative.   Cardiovascular: Negative.   Gastrointestinal: Negative.   Endocrine: Negative.   Genitourinary: Negative.   Musculoskeletal: Negative.   Skin: Negative.   Allergic/Immunologic: Negative.   Neurological: Positive for headaches (For the last few days; He thinks it could be job stress related.).  Hematological: Negative.   Psychiatric/Behavioral: Negative.     Social History      He  reports that he quit smoking about 32 years ago. His smoking use included Cigarettes. He has a 5.00 pack-year smoking history. He has never used smokeless tobacco. He reports that he drinks about 8.4 oz of alcohol per week . He reports that he does not use drugs.       Social History   Social History  . Marital status: Married    Spouse name: Hilda Blades  . Number of children: N/A  . Years of education: College   Occupational History  . Service Technician     Full-Time   Social History Main Topics  . Smoking status: Former Smoker    Packs/day: 0.50    Years: 10.00    Types: Cigarettes    Quit date: 07/25/1984  . Smokeless tobacco: Never Used  . Alcohol use 8.4 oz/week    14 Cans of beer per week     Comment: daily  . Drug use: No  . Sexual  activity: Not Asked   Other Topics Concern  . None   Social History Narrative  . None    Past Medical History:  Diagnosis Date  . GERD (gastroesophageal reflux disease)   . Hernia, hiatal   . Hypertension      Patient Active Problem List   Diagnosis Date Noted  . AA (alcohol abuse) 10/30/2014  . Accumulation of fluid in tissues 10/30/2014  . 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 Arthralgia of hip 10/30/2014  . History of tobacco use 10/30/2014  . Blood glucose elevated 10/30/2014  . LBP (low back pain) 10/30/2014  . Change in blood platelet count 10/30/2014  . Buzzing in ear 10/30/2014  . GERD (gastroesophageal reflux disease)   . Primary localized osteoarthritis of right hip 08/08/2014  . Pre-operative cardiovascular examination 07/18/2014  . Essential hypertension 07/18/2014    Past Surgical History:  Procedure Laterality Date  . bicep surgery Right   . COLONOSCOPY WITH PROPOFOL N/A 08/27/2015   Procedure: COLONOSCOPY WITH PROPOFOL;  Surgeon: Manya Silvas, MD;  Location: [facility] ENDOSCOPY;  Service: Endoscopy;  Laterality: N/A;  . ELBOW SURGERY Left 1979  . HERNIA REPAIR  1969  . TOTAL HIP ARTHROPLASTY Right 08/08/2014   Procedure: RIGHT TOTAL HIP ARTHROPLASTY;  Surgeon: Earlie Server, MD;  Location: [facility];  Service: Orthopedics;  Laterality: Right;    Family History        Family Status  Relation  Status  . Mother Alive  . Father Deceased at age 55  . Brother Alive  . Brother Alive        His family history includes Cancer in his father; Healthy in his brother, brother, and mother; Lung cancer in his father; Stroke in his father; Transient ischemic attack in his father.     Allergies  Allergen Reactions  . Bee Venom Anaphylaxis  . Lisinopril Cough     Current Outpatient Prescriptions:  .  famotidine (PEPCID) 20 MG tablet, Take 20 mg by mouth at bedtime., Disp: , Rfl:  .  triamterene-hydrochlorothiazide (MAXZIDE-25) 37.5-25 MG tablet, Take 1 tablet by  mouth daily., Disp: 90 tablet, Rfl: 3   Patient Care Team: Birdie Sons, MD as PCP - General (Family Medicine)      Objective:   Vitals: BP (!) 148/96 (BP Location: Left Arm, Patient Position: Sitting, Cuff Size: Large)   Pulse 72   Temp 98.2 F (36.8 C) (Oral)   Resp 16   Wt 225 lb (102.1 kg)   BMI 30.52 kg/m    Vitals:   12/19/16 0929  BP: (!) 148/96  Pulse: 72  Resp: 16  Temp: 98.2 F (36.8 C)  TempSrc: Oral  Weight: 225 lb (102.1 kg)     Physical Exam  Constitutional: He is oriented to person, place, and time. He appears well-developed and well-nourished.  HENT:  Head: Normocephalic and atraumatic.  Right Ear: Hearing, tympanic membrane, external ear and ear canal normal.  Left Ear: Hearing, tympanic membrane, external ear and ear canal normal.  Nose: Nose normal.  Mouth/Throat: Oropharynx is clear and moist and mucous membranes are normal.  Eyes: Pupils are equal, round, and reactive to light. Conjunctivae and EOM are normal. Right eye exhibits no discharge.  Neck: Normal range of motion. Neck supple. Carotid bruit is not present. No tracheal deviation present. No thyromegaly present.  Cardiovascular: Normal rate, regular rhythm, normal heart sounds and intact distal pulses.   No murmur heard. Pulmonary/Chest: Effort normal and breath sounds normal. No respiratory distress. He has no wheezes. He has no rales. He exhibits no tenderness.  Abdominal: Soft. He exhibits no distension and no mass. There is no tenderness. There is no rebound and no guarding.  Genitourinary:  Genitourinary Comments: deferred  Musculoskeletal: Normal range of motion. He exhibits no edema or tenderness.  Lymphadenopathy:    He has no cervical adenopathy.  Neurological: He is alert and oriented to person, place, and time. He has normal reflexes. No cranial nerve deficit. He exhibits normal muscle tone. Coordination normal.  Skin: Skin is warm and dry. No rash noted. No erythema.    Psychiatric: He has a normal mood and affect. His behavior is normal. Judgment and thought content normal.  Vitals reviewed.    Depression Screen PHQ 2/9 Scores 12/19/2016 06/10/2015  PHQ - 2 Score 0 0  PHQ- 9 Score 0 -      Assessment & Plan:     Routine Health Maintenance and Physical Exam  Exercise Activities and Dietary recommendations Goals    . Exercise 150 minutes per week (moderate activity)    . Reduce alcohol intake to 1 to 2 servings per day       Immunization History  Administered Date(s) Administered  . Td 12/13/2011  . Tdap 06/01/2016    Health Maintenance  Topic Date Due  . Hepatitis C Screening  January 18, 1961  . HIV Screening  03/04/1974  . INFLUENZA VACCINE  12/28/2016  . COLONOSCOPY  08/26/2025  . TETANUS/TDAP  06/01/2026     Discussed health benefits of physical activity, and encouraged him to engage in regular exercise appropriate for his age and condition.    1. Annual physical exam Normal physical exam today. Will check labs as below and f/u pending lab results. If labs are stable and WNL he will not need to have these rechecked for one year at his next annual physical exam. He is to call the office in the meantime if he has any acute issue, questions or concerns. - CBC w/Diff/Platelet - Comprehensive Metabolic Panel (CMET) - TSH - HgB A1c - Lipid Profile  2. History of smoking Still not smoking.   3. Essential hypertension Elevated today. Will increase Maxzide to BID dosing. He is to check his BP at home and call if still elevated, or if he has side effects to the increased dose.  - triamterene-hydrochlorothiazide (MAXZIDE-25) 37.5-25 MG tablet; Take 1 tablet by mouth 2 (two) times daily.  Dispense: 180 tablet; Refill: 1 - CBC w/Diff/Platelet - Comprehensive Metabolic Panel (CMET)  4. Gastroesophageal reflux disease without esophagitis Controlled on famotodine 20mg .   5. Blood glucose elevated Elevated last year. Will check A1c as  below.  - HgB A1c  6. Encounter for lipid screening for cardiovascular disease Will check labs as below and f/u pending results. - Lipid Profile  7. Prostate cancer screening Will check labs as below and f/u pending results. - PSA  --------------------------------------------------------------------    Mar Daring, PA-C  [facility]

## 2016-12-21 LAB — CBC WITH DIFFERENTIAL/PLATELET
BASOS ABS: 0 10*3/uL (ref 0.0–0.2)
BASOS: 0 %
EOS (ABSOLUTE): 0.1 10*3/uL (ref 0.0–0.4)
Eos: 2 %
HEMOGLOBIN: 16 g/dL (ref 13.0–17.7)
Hematocrit: 46.4 % (ref 37.5–51.0)
IMMATURE GRANS (ABS): 0 10*3/uL (ref 0.0–0.1)
IMMATURE GRANULOCYTES: 0 %
Lymphocytes Absolute: 1.1 10*3/uL (ref 0.7–3.1)
Lymphs: 20 %
MCH: 34.3 pg — AB (ref 26.6–33.0)
MCHC: 34.5 g/dL (ref 31.5–35.7)
MCV: 99 fL — ABNORMAL HIGH (ref 79–97)
MONOCYTES: 5 %
Monocytes Absolute: 0.3 10*3/uL (ref 0.1–0.9)
NEUTROS ABS: 4 10*3/uL (ref 1.4–7.0)
NEUTROS PCT: 73 %
PLATELETS: 113 10*3/uL — AB (ref 150–379)
RBC: 4.67 x10E6/uL (ref 4.14–5.80)
RDW: 13 % (ref 12.3–15.4)
WBC: 5.5 10*3/uL (ref 3.4–10.8)

## 2016-12-21 LAB — COMPREHENSIVE METABOLIC PANEL
ALBUMIN: 4.9 g/dL (ref 3.5–5.5)
ALK PHOS: 104 IU/L (ref 39–117)
ALT: 23 IU/L (ref 0–44)
AST: 26 IU/L (ref 0–40)
Albumin/Globulin Ratio: 1.8 (ref 1.2–2.2)
BILIRUBIN TOTAL: 0.9 mg/dL (ref 0.0–1.2)
BUN/Creatinine Ratio: 15 (ref 9–20)
BUN: 14 mg/dL (ref 6–24)
CHLORIDE: 97 mmol/L (ref 96–106)
CO2: 26 mmol/L (ref 20–29)
CREATININE: 0.94 mg/dL (ref 0.76–1.27)
Calcium: 9.7 mg/dL (ref 8.7–10.2)
GFR calc Af Amer: 104 mL/min/{1.73_m2} (ref >59)
GFR calc non Af Amer: 90 mL/min/{1.73_m2} (ref >59)
Globulin, Total: 2.8 g/dL (ref 1.5–4.5)
Glucose: 115 mg/dL — ABNORMAL HIGH (ref 65–99)
Potassium: 4.5 mmol/L (ref 3.5–5.2)
Sodium: 138 mmol/L (ref 134–144)
Total Protein: 7.7 g/dL (ref 6.0–8.5)

## 2016-12-21 LAB — LIPID PANEL
CHOL/HDL RATIO: 4.3 ratio (ref 0.0–5.0)
Cholesterol, Total: 202 mg/dL — ABNORMAL HIGH (ref 100–199)
HDL: 47 mg/dL (ref >39)
LDL CALC: 135 mg/dL — AB (ref 0–99)
TRIGLYCERIDES: 102 mg/dL (ref 0–149)
VLDL Cholesterol Cal: 20 mg/dL (ref 5–40)

## 2016-12-21 LAB — HEMOGLOBIN A1C
Est. average glucose Bld gHb Est-mCnc: 117 mg/dL
HEMOGLOBIN A1C: 5.7 % — AB (ref 4.8–5.6)

## 2016-12-21 LAB — PSA: PROSTATE SPECIFIC AG, SERUM: 6.6 ng/mL — AB (ref 0.0–4.0)

## 2016-12-21 LAB — TSH: TSH: 1.17 u[IU]/mL (ref 0.450–4.500)

## 2017-01-31 ENCOUNTER — Telehealth: Payer: Self-pay | Admitting: Physician Assistant

## 2017-01-31 DIAGNOSIS — R972 Elevated prostate specific antigen [PSA]: Secondary | ICD-10-CM

## 2017-01-31 NOTE — Telephone Encounter (Signed)
Patient wants to be referred to urologist now for elevated PSA.     He needs the earliest appt. in the mornings.

## 2017-01-31 NOTE — Telephone Encounter (Signed)
Referral placed.

## 2017-02-23 ENCOUNTER — Ambulatory Visit: Payer: BLUE CROSS/BLUE SHIELD | Admitting: Urology

## 2017-03-14 ENCOUNTER — Encounter: Payer: Self-pay | Admitting: Urology

## 2017-03-14 ENCOUNTER — Ambulatory Visit: Payer: BLUE CROSS/BLUE SHIELD | Admitting: Urology

## 2017-03-14 VITALS — BP 163/89 | HR 105 | Ht 72.0 in | Wt 227.5 lb

## 2017-03-14 DIAGNOSIS — R972 Elevated prostate specific antigen [PSA]: Secondary | ICD-10-CM

## 2017-03-14 LAB — URINALYSIS, COMPLETE
Bilirubin, UA: NEGATIVE
GLUCOSE, UA: NEGATIVE
KETONES UA: NEGATIVE
LEUKOCYTES UA: NEGATIVE
Nitrite, UA: NEGATIVE
Protein, UA: NEGATIVE
SPEC GRAV UA: 1.01 (ref 1.005–1.030)
Urobilinogen, Ur: 0.2 mg/dL (ref 0.2–1.0)
pH, UA: 6.5 (ref 5.0–7.5)

## 2017-03-14 LAB — MICROSCOPIC EXAMINATION
Bacteria, UA: NONE SEEN
Epithelial Cells (non renal): NONE SEEN /hpf (ref 0–10)
WBC, UA: NONE SEEN /hpf (ref 0–?)

## 2017-03-14 NOTE — Progress Notes (Signed)
03/14/2017 1:52 PM   Alfonso Ramus [birth year] [id number]  Referring provider: Mar Daring, PA-C [address] [place] [address] [place], [place] [address]  Chief Complaint  Patient presents with  . Elevated PSA    HPI: Consultation for PSA elevation. His PSA was 6.6 12/19/2016. No priors. No prostate biopsy. No h/o BPH.  No FH PCa.  AUASS = 4, qol pleased.  IIEF = 23  Modifying factors: There are no other modifying factors  Associated signs and symptoms: There are no other associated signs and symptoms Aggravating and relieving factors: There are no other aggravating or relieving factors Severity: Moderate Duration: Persistent     PMH: Past Medical History:  Diagnosis Date  . GERD (gastroesophageal reflux disease)   . Hernia, hiatal   . Hypertension     Surgical History: Past Surgical History:  Procedure Laterality Date  . bicep surgery Right   . COLONOSCOPY WITH PROPOFOL N/A 08/27/2015   Procedure: COLONOSCOPY WITH PROPOFOL;  Surgeon: Manya Silvas, MD;  Location: [facility] ENDOSCOPY;  Service: Endoscopy;  Laterality: N/A;  . ELBOW SURGERY Left 1979  . HERNIA REPAIR  1969  . TOTAL HIP ARTHROPLASTY Right 08/08/2014   Procedure: RIGHT TOTAL HIP ARTHROPLASTY;  Surgeon: Earlie Server, MD;  Location: [facility];  Service: Orthopedics;  Laterality: Right;    Home Medications:  Allergies as of 03/14/2017      Reactions   Bee Venom Anaphylaxis   Lisinopril Cough      Medication List       Accurate as of 03/14/17  1:52 PM. Always use your most recent med list.          famotidine 20 MG tablet Commonly known as:  PEPCID Take 20 mg by mouth at bedtime.   triamterene-hydrochlorothiazide 37.5-25 MG tablet Commonly known as:  MAXZIDE-25 Take 1 tablet by mouth 2 (two) times daily.       Allergies:  Allergies  Allergen Reactions  . Bee Venom Anaphylaxis  . Lisinopril Cough    Family History: Family History  Problem Relation Age of Onset  . Healthy  Mother   . Cancer Father   . Stroke Father   . Transient ischemic attack Father   . Lung cancer Father   . Healthy Brother   . Healthy Brother   . Bladder Cancer Neg Hx   . Kidney cancer Neg Hx   . Prostate cancer Neg Hx     Social History:  reports that he quit smoking about 32 years ago. His smoking use included Cigarettes. He has a 5.00 pack-year smoking history. He has never used smokeless tobacco. He reports that he drinks about 8.4 oz of alcohol per week . He reports that he does not use drugs.  ROS: UROLOGY Frequent Urination?: No Hard to postpone urination?: No Burning/pain with urination?: No Get up at night to urinate?: Yes Leakage of urine?: No Urine stream starts and stops?: No Trouble starting stream?: No Do you have to strain to urinate?: No Blood in urine?: No Urinary tract infection?: No Sexually transmitted disease?: No Injury to kidneys or bladder?: No Painful intercourse?: No Weak stream?: No Erection problems?: No Penile pain?: No  Gastrointestinal Nausea?: No Vomiting?: No Indigestion/heartburn?: No Diarrhea?: No Constipation?: Yes  Constitutional Fever: No Night sweats?: Yes Weight loss?: No Fatigue?: No  Skin Skin rash/lesions?: No Itching?: No  Eyes Blurred vision?: No Double vision?: No  Ears/Nose/Throat Sore throat?: No Sinus problems?: No  Hematologic/Lymphatic Swollen glands?: No Easy bruising?: No  Cardiovascular Leg swelling?: No Chest pain?: No  Respiratory Cough?: No Shortness of breath?: No  Endocrine Excessive thirst?: No  Musculoskeletal Back pain?: No Joint pain?: No  Neurological Headaches?: No Dizziness?: No  Psychologic Depression?: No Anxiety?: No  Physical Exam: BP (!) 163/89 (BP Location: Right Arm, Patient Position: Sitting, Cuff Size: Normal)   Pulse (!) 105   Ht 6' (1.829 m)   Wt 103.2 kg (227 lb 8 oz)   BMI 30.85 kg/m   Constitutional:  Alert and oriented, No acute  distress. HEENT: [place] AT, moist mucus membranes.  Trachea midline, no masses. Cardiovascular: No clubbing, cyanosis, or edema. Respiratory: Normal respiratory effort, no increased work of breathing. GI: Abdomen is soft, nontender, nondistended, no abdominal masses GU: No CVA tenderness.  Penis: circumcised and normal - no lesion  Scrotum: Normal, testicles palpably normal.  DRE: Prostate 40-50 g, smooth without hard area or nodule. All landmarks preserved. Skin: No rashes, bruises or suspicious lesions. Lymph: No cervical or inguinal adenopathy. Neurologic: Grossly intact, no focal deficits, moving all 4 extremities. Psychiatric: Normal mood and affect.  Laboratory Data: Lab Results  Component Value Date   WBC 5.5 12/19/2016   HGB 16.0 12/19/2016   HCT 46.4 12/19/2016   MCV 99 (H) 12/19/2016   PLT 113 (L) 12/19/2016    Lab Results  Component Value Date   CREATININE 0.94 12/19/2016    Lab Results  Component Value Date   PSA1 6.6 (H) 12/19/2016    No results found for: TESTOSTERONE  Lab Results  Component Value Date   HGBA1C 5.7 (H) 12/19/2016    Urinalysis Lab Results  Component Value Date   APPEARANCEUR CLEAR 07/25/2014   LEUKOCYTESUR NEGATIVE 07/25/2014   PROTEINUR NEGATIVE 07/25/2014   GLUCOSEU NEGATIVE 07/25/2014   BILIRUBINUR NEGATIVE 07/25/2014   NITRITE NEGATIVE 07/25/2014    No results found for: LABMICR, WBCUA, RBCUA, LABEPIT, MUCUS, BACTERIA    Assessment & Plan:    1. Elevated PSA - we had a good discussion regarding the nature of elevated PSA-benign and malignant causes. We discussed PSA ranges in general and regarding to age. We discussed the nature risks and benefits of PSA screening and that the management of prostate cancer might include active surveillance or treatment. We discussed the nature risks benefits and alternatives to transrectal ultrasound prostate biopsy. All questions answered. His DRE is normal today. PSA was sent to determine f/u  -- surveillance/further screening vs biopsy.   - Urinalysis, Complete   No Follow-up on file.  Festus Aloe, [facility] [address], [place], [place] [address] [phone]

## 2017-03-15 LAB — PSA TOTAL (REFLEX TO FREE): Prostate Specific Ag, Serum: 7.7 ng/mL — ABNORMAL HIGH (ref 0.0–4.0)

## 2017-03-15 LAB — FPSA% REFLEX
% FREE PSA: 17.9 %
PSA, FREE: 1.38 ng/mL

## 2017-03-20 ENCOUNTER — Telehealth: Payer: Self-pay

## 2017-03-20 NOTE — Telephone Encounter (Signed)
Festus Aloe, MD  Lestine Box, LPN        Notify patient his PSA has risen to 7.7. I recommend a prostate biopsy as we discussed. Please go over instructions and schedule. Thanks.    No answer.

## 2017-03-21 NOTE — Telephone Encounter (Signed)
No answer

## 2017-03-22 NOTE — Telephone Encounter (Signed)
Certified letter sent. Tracking number- [id number].

## 2017-03-22 NOTE — Telephone Encounter (Signed)
No answer. Will send a letter.  

## 2017-05-04 ENCOUNTER — Other Ambulatory Visit: Payer: Self-pay | Admitting: Urology

## 2017-05-04 ENCOUNTER — Ambulatory Visit: Payer: BLUE CROSS/BLUE SHIELD | Admitting: Urology

## 2017-05-04 ENCOUNTER — Encounter: Payer: Self-pay | Admitting: Urology

## 2017-05-04 VITALS — BP 186/67 | HR 101 | Ht 72.0 in | Wt 233.0 lb

## 2017-05-04 DIAGNOSIS — R972 Elevated prostate specific antigen [PSA]: Secondary | ICD-10-CM

## 2017-05-04 MED ORDER — LIDOCAINE HCL 2 % EX GEL
1.0000 "application " | Freq: Once | CUTANEOUS | Status: AC
  Administered 2017-05-04: 1 via URETHRAL

## 2017-05-04 MED ORDER — GENTAMICIN SULFATE 40 MG/ML IJ SOLN
80.0000 mg | Freq: Once | INTRAMUSCULAR | Status: AC
  Administered 2017-05-04: 80 mg via INTRAMUSCULAR

## 2017-05-04 MED ORDER — LEVOFLOXACIN 500 MG PO TABS
500.0000 mg | ORAL_TABLET | Freq: Once | ORAL | Status: AC
  Administered 2017-05-04: 500 mg via ORAL

## 2017-05-04 NOTE — Progress Notes (Signed)
Prostate Biopsy Procedure   Informed consent was obtained after discussing risks/benefits of the procedure.  A time out was performed to ensure correct patient identity.  Pre-Procedure: - Last PSA Level: 7.7 03/14/2017 - Gentamicin given prophylactically - Levaquin 500 mg administered PO -Transrectal Ultrasound performed revealing a 79 gm prostate -No significant hypoechoic or median lobe noted  Procedure: - Prostate block performed using 10 cc 1% lidocaine and biopsies taken from sextant areas, a total of 12 under ultrasound guidance.  Post-Procedure: - Patient tolerated the procedure well - He was counseled to seek immediate medical attention if experiences any severe pain, significant bleeding, or fevers - Return in one week to discuss biopsy results

## 2017-05-11 LAB — PATHOLOGY REPORT

## 2017-05-12 ENCOUNTER — Other Ambulatory Visit: Payer: Self-pay | Admitting: Urology

## 2017-05-15 ENCOUNTER — Other Ambulatory Visit: Payer: Self-pay | Admitting: Physician Assistant

## 2017-05-15 ENCOUNTER — Telehealth: Payer: Self-pay

## 2017-05-15 DIAGNOSIS — I10 Essential (primary) hypertension: Secondary | ICD-10-CM

## 2017-05-15 DIAGNOSIS — R972 Elevated prostate specific antigen [PSA]: Secondary | ICD-10-CM

## 2017-05-15 NOTE — Telephone Encounter (Signed)
Spoke with pt in reference to negative bx. Pt voiced understanding. OV and lab appts made in 35mo. Orders placed.

## 2017-05-15 NOTE — Telephone Encounter (Signed)
-----   Message from Abbie Sons, MD sent at 05/14/2017  9:30 AM EST ----- Please let patient know that prostate biopsy was negative for cancer.  If he has no post biopsy problems can cancel biopsy f/u appt and would recc psa/DRE in 6 months.

## 2017-05-19 ENCOUNTER — Ambulatory Visit: Payer: BLUE CROSS/BLUE SHIELD | Admitting: Urology

## 2017-11-08 ENCOUNTER — Other Ambulatory Visit: Payer: BLUE CROSS/BLUE SHIELD

## 2017-11-08 DIAGNOSIS — R972 Elevated prostate specific antigen [PSA]: Secondary | ICD-10-CM | POA: Diagnosis not present

## 2017-11-09 LAB — PSA: Prostate Specific Ag, Serum: 9.7 ng/mL — ABNORMAL HIGH (ref 0.0–4.0)

## 2017-11-12 ENCOUNTER — Other Ambulatory Visit: Payer: Self-pay | Admitting: Physician Assistant

## 2017-11-12 DIAGNOSIS — I10 Essential (primary) hypertension: Secondary | ICD-10-CM

## 2017-11-13 ENCOUNTER — Ambulatory Visit: Payer: Self-pay | Admitting: Urology

## 2017-12-13 ENCOUNTER — Encounter: Payer: Self-pay | Admitting: Urology

## 2017-12-13 ENCOUNTER — Ambulatory Visit: Payer: BLUE CROSS/BLUE SHIELD | Admitting: Urology

## 2017-12-13 VITALS — BP 158/88 | HR 67 | Resp 16 | Ht 72.0 in | Wt 227.4 lb

## 2017-12-13 DIAGNOSIS — R972 Elevated prostate specific antigen [PSA]: Secondary | ICD-10-CM

## 2017-12-13 NOTE — Progress Notes (Signed)
12/13/2017 1:21 PM   Chad Matthews [birth year] [id number]  Referring provider: Mar Daring, PA-C [place], [place] [address]  Chief Complaint  Patient presents with  . Elevated PSA   Urologic problems: -Elevated PSA; prostate biopsy December 2018 for a PSA of 7.7.  Prostate volume 79 g.  Pathology all cores benign prostate tissue.  HPI: 57 year old male presents for follow-up of the above problem list.  He has no bothersome lower urinary tract symptoms.  Denies dysuria or gross hematuria.  A PSA drawn on 11/08/2017 was 9.7.   PMH: Past Medical History:  Diagnosis Date  . GERD (gastroesophageal reflux disease)   . Hernia, hiatal   . Hypertension     Surgical History: Past Surgical History:  Procedure Laterality Date  . bicep surgery Right   . COLONOSCOPY WITH PROPOFOL N/A 08/27/2015   Procedure: COLONOSCOPY WITH PROPOFOL;  Surgeon: Manya Silvas, MD;  Location: [facility] ENDOSCOPY;  Service: Endoscopy;  Laterality: N/A;  . ELBOW SURGERY Left 1979  . HERNIA REPAIR  1969  . TOTAL HIP ARTHROPLASTY Right 08/08/2014   Procedure: RIGHT TOTAL HIP ARTHROPLASTY;  Surgeon: Earlie Server, MD;  Location: [facility];  Service: Orthopedics;  Laterality: Right;    Home Medications:  Allergies as of 12/13/2017      Reactions   Bee Venom Anaphylaxis   Lisinopril Cough      Medication List        Accurate as of 12/13/17  1:21 PM. Always use your most recent med list.          famotidine 20 MG tablet Commonly known as:  PEPCID Take 20 mg by mouth at bedtime.   triamterene-hydrochlorothiazide 37.5-25 MG tablet Commonly known as:  MAXZIDE-25 TAKE 1 TABLET BY MOUTH TWICE A DAY       Allergies:  Allergies  Allergen Reactions  . Bee Venom Anaphylaxis  . Lisinopril Cough    Family History: Family History  Problem Relation Age of Onset  . Healthy Mother   . Cancer Father   . Stroke Father   . Transient ischemic attack Father   . Lung  cancer Father   . Healthy Brother   . Healthy Brother   . Bladder Cancer Neg Hx   . Kidney cancer Neg Hx   . Prostate cancer Neg Hx     Social History:  reports that he quit smoking about 33 years ago. His smoking use included cigarettes. He has a 5.00 pack-year smoking history. He has never used smokeless tobacco. He reports that he drinks about 8.4 oz of alcohol per week. He reports that he does not use drugs.  ROS: UROLOGY Frequent Urination?: No Hard to postpone urination?: No Burning/pain with urination?: No Get up at night to urinate?: Yes Leakage of urine?: No Urine stream starts and stops?: No Trouble starting stream?: No Do you have to strain to urinate?: No Blood in urine?: No Urinary tract infection?: No Sexually transmitted disease?: No Injury to kidneys or bladder?: No Painful intercourse?: No Weak stream?: No Erection problems?: No Penile pain?: No  Gastrointestinal Nausea?: No Vomiting?: No Indigestion/heartburn?: Yes Diarrhea?: No Constipation?: No  Constitutional Fever: No Night sweats?: Yes Weight loss?: No Fatigue?: No  Skin Skin rash/lesions?: No Itching?: No  Eyes Blurred vision?: No Double vision?: No  Ears/Nose/Throat Sore throat?: No Sinus problems?: No  Hematologic/Lymphatic Swollen glands?: No Easy bruising?: No  Cardiovascular Leg swelling?: No Chest pain?: No  Respiratory Cough?: No Shortness of breath?:  No  Endocrine Excessive thirst?: No  Musculoskeletal Back pain?: No Joint pain?: No  Neurological Headaches?: No Dizziness?: No  Psychologic Depression?: No Anxiety?: No  Physical Exam: BP (!) 158/88   Pulse 67   Resp 16   Ht 6' (1.829 m)   Wt 227 lb 6.4 oz (103.1 kg)   SpO2 96%   BMI 30.84 kg/m   Constitutional:  Alert and oriented, No acute distress. HEENT: [place] AT, moist mucus membranes.  Trachea midline, no masses. Cardiovascular: No clubbing, cyanosis, or edema. Respiratory: Normal respiratory  effort, no increased work of breathing. GI: Abdomen is soft, nontender, nondistended, no abdominal masses GU: No CVA tenderness.  Prostate 60 g, smooth without nodules Lymph: No cervical or inguinal lymphadenopathy. Skin: No rashes, bruises or suspicious lesions. Neurologic: Grossly intact, no focal deficits, moving all 4 extremities. Psychiatric: Normal mood and affect.  Assessment & Plan:   57 year old male with a previous benign prostate biopsy and rising PSA.  DRE is unremarkable.  Options were discussed including prostate MRI, repeat standard biopsy and observation.  I recommended a prostate MRI.   Abbie Sons, [place] [address] [place], [place] [address] [phone]

## 2017-12-21 ENCOUNTER — Ambulatory Visit (INDEPENDENT_AMBULATORY_CARE_PROVIDER_SITE_OTHER): Payer: BLUE CROSS/BLUE SHIELD | Admitting: Physician Assistant

## 2017-12-21 ENCOUNTER — Encounter: Payer: Self-pay | Admitting: Physician Assistant

## 2017-12-21 VITALS — BP 150/90 | HR 85 | Temp 98.4°F | Resp 16 | Ht 72.0 in | Wt 229.2 lb

## 2017-12-21 DIAGNOSIS — I10 Essential (primary) hypertension: Secondary | ICD-10-CM

## 2017-12-21 DIAGNOSIS — Z1159 Encounter for screening for other viral diseases: Secondary | ICD-10-CM | POA: Diagnosis not present

## 2017-12-21 DIAGNOSIS — Z114 Encounter for screening for human immunodeficiency virus [HIV]: Secondary | ICD-10-CM

## 2017-12-21 DIAGNOSIS — Z862 Personal history of diseases of the blood and blood-forming organs and certain disorders involving the immune mechanism: Secondary | ICD-10-CM | POA: Insufficient documentation

## 2017-12-21 DIAGNOSIS — D696 Thrombocytopenia, unspecified: Secondary | ICD-10-CM

## 2017-12-21 DIAGNOSIS — Z0000 Encounter for general adult medical examination without abnormal findings: Secondary | ICD-10-CM | POA: Diagnosis not present

## 2017-12-21 DIAGNOSIS — R739 Hyperglycemia, unspecified: Secondary | ICD-10-CM | POA: Diagnosis not present

## 2017-12-21 MED ORDER — AMLODIPINE BESYLATE 5 MG PO TABS: 5.0000 mg | ORAL_TABLET | Freq: Every day | ORAL | 3 refills | Status: DC

## 2017-12-21 NOTE — Patient Instructions (Signed)

## 2017-12-21 NOTE — Progress Notes (Signed)
Patient: Chad Matthews, Male    DOB: [birth year], 57 y.o.   MRN: [id number] Visit Date: 12/21/2017  Today's Provider: Mar Daring, PA-C   Chief Complaint  Patient presents with  . Annual Exam   Subjective:    Annual physical exam Chad Matthews is a 57 y.o. male who presents today for health maintenance and complete physical. He feels well. He reports exercising none. He reports he is sleeping well.  PSA: pt is followed by Urology for Elevated PSA, LOV 12/13/17 -----------------------------------------------------------------   Review of Systems  Constitutional: Negative.   HENT: Positive for hearing loss and tinnitus.   Eyes: Negative.   Respiratory: Negative.   Cardiovascular: Negative.   Gastrointestinal: Negative.   Endocrine: Negative.   Genitourinary: Negative.   Musculoskeletal: Negative.   Skin: Negative.   Allergic/Immunologic: Negative.   Neurological: Negative.   Hematological: Negative.   Psychiatric/Behavioral: Negative.     Social History      He  reports that he quit smoking about 33 years ago. His smoking use included cigarettes. He has a 5.00 pack-year smoking history. He has never used smokeless tobacco. He reports that he drinks about 8.4 oz of alcohol per week. He reports that he does not use drugs.       Social History   Socioeconomic History  . Marital status: Married    Spouse name: Hilda Blades  . Number of children: Not on file  . Years of education: College  . Highest education level: Not on file  Occupational History  . Occupation: Armed forces technical officer    Comment: [employer]  . Financial resource strain: Not on file  . Food insecurity:    Worry: Not on file    Inability: Not on file  . Transportation needs:    Medical: Not on file    Non-medical: Not on file  Tobacco Use  . Smoking status: Former Smoker    Packs/day: 0.50    Years: 10.00    Pack years: 5.00    Types: Cigarettes    Last attempt to  quit: 07/25/1984    Years since quitting: 33.4  . Smokeless tobacco: Never Used  Substance and Sexual Activity  . Alcohol use: Yes    Alcohol/week: 8.4 oz    Types: 14 Cans of beer per week    Comment: daily  . Drug use: No  . Sexual activity: Not on file  Lifestyle  . Physical activity:    Days per week: Not on file    Minutes per session: Not on file  . Stress: Not on file  Relationships  . Social connections:    Talks on phone: Not on file    Gets together: Not on file    Attends religious service: Not on file    Active member of club or organization: Not on file    Attends meetings of clubs or organizations: Not on file    Relationship status: Not on file  Other Topics Concern  . Not on file  Social History Narrative  . Not on file    Past Medical History:  Diagnosis Date  . GERD (gastroesophageal reflux disease)   . Hernia, hiatal   . Hypertension      Patient Active Problem List   Diagnosis Date Noted  . History of smoking 12/19/2016  . AA (alcohol abuse) 10/30/2014  . Accumulation of fluid in tissues 10/30/2014  . 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 Arthralgia of hip  10/30/2014  . Blood glucose elevated 10/30/2014  . LBP (low back pain) 10/30/2014  . Buzzing in ear 10/30/2014  . GERD (gastroesophageal reflux disease)   . Primary localized osteoarthritis of right hip 08/08/2014  . Essential hypertension 07/18/2014    Past Surgical History:  Procedure Laterality Date  . bicep surgery Right   . COLONOSCOPY WITH PROPOFOL N/A 08/27/2015   Procedure: COLONOSCOPY WITH PROPOFOL;  Surgeon: Manya Silvas, MD;  Location: [facility] ENDOSCOPY;  Service: Endoscopy;  Laterality: N/A;  . ELBOW SURGERY Left 1979  . HERNIA REPAIR  1969  . TOTAL HIP ARTHROPLASTY Right 08/08/2014   Procedure: RIGHT TOTAL HIP ARTHROPLASTY;  Surgeon: Earlie Server, MD;  Location: [facility];  Service: Orthopedics;  Laterality: Right;    Family History        Family Status  Relation Name Status   . Mother  Alive  . Father  Deceased at age 55  . Brother  Alive  . Brother  Alive  . Neg Hx  (Not Specified)        His family history includes Cancer in his father; Healthy in his brother, brother, and mother; Lung cancer in his father; Stroke in his father; Transient ischemic attack in his father. There is no history of Bladder Cancer, Kidney cancer, or Prostate cancer.      Allergies  Allergen Reactions  . Bee Venom Anaphylaxis  . Lisinopril Cough     Current Outpatient Medications:  .  famotidine (PEPCID) 20 MG tablet, Take 20 mg by mouth at bedtime., Disp: , Rfl:  .  triamterene-hydrochlorothiazide (MAXZIDE-25) 37.5-25 MG tablet, TAKE 1 TABLET BY MOUTH TWICE A DAY, Disp: 180 tablet, Rfl: 1   Patient Care Team: Mar Daring, PA-C as PCP - General (Family Medicine)      Objective:   Vitals: BP (!) 150/90 (BP Location: Left Arm, Patient Position: Sitting, Cuff Size: Normal)   Pulse 85   Temp 98.4 F (36.9 C) (Oral)   Resp 16   Ht 6' (1.829 m)   Wt 229 lb 3.2 oz (104 kg)   BMI 31.09 kg/m    Vitals:   12/21/17 0853  BP: (!) 150/90  Pulse: 85  Resp: 16  Temp: 98.4 F (36.9 C)  TempSrc: Oral  Weight: 229 lb 3.2 oz (104 kg)  Height: 6' (1.829 m)     Physical Exam  Constitutional: He is oriented to person, place, and time. He appears well-developed and well-nourished.  HENT:  Head: Normocephalic and atraumatic.  Right Ear: External ear normal.  Left Ear: External ear normal.  Nose: Nose normal.  Mouth/Throat: Oropharynx is clear and moist.  Eyes: Pupils are equal, round, and reactive to light. Conjunctivae and EOM are normal. Right eye exhibits no discharge.  Neck: Normal range of motion. Neck supple. No tracheal deviation present. No thyromegaly present.  Cardiovascular: Normal rate, regular rhythm, normal heart sounds and intact distal pulses.  No murmur heard. Pulmonary/Chest: Effort normal and breath sounds normal. No respiratory distress. He has  no wheezes. He has no rales. He exhibits no tenderness.  Abdominal: Soft. Bowel sounds are normal. He exhibits no distension and no mass. There is no tenderness. There is no rebound and no guarding.  Genitourinary:  Genitourinary Comments: Deferred to urology  Musculoskeletal: Normal range of motion. He exhibits no edema or tenderness.  Lymphadenopathy:    He has no cervical adenopathy.  Neurological: He is alert and oriented to person, place, and time. He has normal reflexes. He  displays normal reflexes. No cranial nerve deficit. He exhibits normal muscle tone. Coordination normal.  Skin: Skin is warm and dry. No rash noted. No erythema.  Psychiatric: He has a normal mood and affect. His behavior is normal. Judgment and thought content normal.  Vitals reviewed.    Depression Screen PHQ 2/9 Scores 12/21/2017 12/19/2016 06/10/2015  PHQ - 2 Score 0 0 0  PHQ- 9 Score - 0 -      Assessment & Plan:     Routine Health Maintenance and Physical Exam  Exercise Activities and Dietary recommendations Goals    . Exercise 150 minutes per week (moderate activity)    . Reduce alcohol intake to 1 to 2 servings per day       Immunization History  Administered Date(s) Administered  . Td 12/13/2011  . Tdap 06/01/2016    Health Maintenance  Topic Date Due  . Hepatitis C Screening  1960/09/12  . HIV Screening  03/04/1974  . INFLUENZA VACCINE  12/28/2017  . COLONOSCOPY  08/27/2018  . TETANUS/TDAP  06/01/2026     Discussed health benefits of physical activity, and encouraged him to engage in regular exercise appropriate for his age and condition.    1. Annual physical exam Normal physical exam today. Will check labs as below and f/u pending lab results. If labs are stable and WNL he will not need to have these rechecked for one year at his next annual physical exam. He is to call the office in the meantime if he has any acute issue, questions or concerns. - CBC with  Differential/Platelet - Comprehensive metabolic panel - Hemoglobin A1c - Lipid panel - TSH - amLODipine (NORVASC) 5 MG tablet; Take 1 tablet (5 mg total) by mouth daily.  Dispense: 90 tablet; Refill: 3  2. Essential hypertension Elevated. Will add amlodipine 5mg . Continue Maxzide 37.5-25mg . Will check labs as below and f/u pending results. - CBC with Differential/Platelet - Comprehensive metabolic panel - Hemoglobin A1c - Lipid panel - TSH  3. Blood glucose elevated H/O this. Diet controlled. Will check labs as below and f/u pending results. - CBC with Differential/Platelet - Comprehensive metabolic panel - Hemoglobin A1c - Lipid panel - TSH  4. Need for hepatitis C screening test - Hepatitis C antibody  5. Screening for HIV without presence of risk factors - HIV antibody  6. Thrombocytopenia ([place]) Checking labs as noted above. Last year stable.   --------------------------------------------------------------------    Mar Daring, PA-C  [facility]

## 2017-12-22 ENCOUNTER — Telehealth: Payer: Self-pay

## 2017-12-22 LAB — LIPID PANEL
CHOLESTEROL TOTAL: 168 mg/dL (ref 100–199)
Chol/HDL Ratio: 5.3 ratio — ABNORMAL HIGH (ref 0.0–5.0)
HDL: 32 mg/dL — ABNORMAL LOW (ref >39)
LDL Calculated: 85 mg/dL (ref 0–99)
Triglycerides: 255 mg/dL — ABNORMAL HIGH (ref 0–149)
VLDL Cholesterol Cal: 51 mg/dL — ABNORMAL HIGH (ref 5–40)

## 2017-12-22 LAB — CBC WITH DIFFERENTIAL/PLATELET
BASOS ABS: 0 10*3/uL (ref 0.0–0.2)
Basos: 1 %
EOS (ABSOLUTE): 0.1 10*3/uL (ref 0.0–0.4)
EOS: 3 %
Hematocrit: 42.6 % (ref 37.5–51.0)
Hemoglobin: 14.7 g/dL (ref 13.0–17.7)
IMMATURE GRANULOCYTES: 0 %
Immature Grans (Abs): 0 10*3/uL (ref 0.0–0.1)
Lymphocytes Absolute: 1 10*3/uL (ref 0.7–3.1)
Lymphs: 22 %
MCH: 33.6 pg — AB (ref 26.6–33.0)
MCHC: 34.5 g/dL (ref 31.5–35.7)
MCV: 97 fL (ref 79–97)
MONOS ABS: 0.4 10*3/uL (ref 0.1–0.9)
Monocytes: 8 %
NEUTROS PCT: 66 %
Neutrophils Absolute: 3 10*3/uL (ref 1.4–7.0)
Platelets: 88 10*3/uL — CL (ref 150–450)
RBC: 4.38 x10E6/uL (ref 4.14–5.80)
RDW: 12 % — ABNORMAL LOW (ref 12.3–15.4)
WBC: 4.6 10*3/uL (ref 3.4–10.8)

## 2017-12-22 LAB — COMPREHENSIVE METABOLIC PANEL
ALT: 25 IU/L (ref 0–44)
AST: 27 IU/L (ref 0–40)
Albumin/Globulin Ratio: 1.8 (ref 1.2–2.2)
Albumin: 4.6 g/dL (ref 3.5–5.5)
Alkaline Phosphatase: 101 IU/L (ref 39–117)
BUN/Creatinine Ratio: 14 (ref 9–20)
BUN: 13 mg/dL (ref 6–24)
Bilirubin Total: 0.9 mg/dL (ref 0.0–1.2)
CO2: 23 mmol/L (ref 20–29)
Calcium: 9.1 mg/dL (ref 8.7–10.2)
Chloride: 94 mmol/L — ABNORMAL LOW (ref 96–106)
Creatinine, Ser: 0.9 mg/dL (ref 0.76–1.27)
GFR calc Af Amer: 108 mL/min/{1.73_m2} (ref >59)
GFR calc non Af Amer: 94 mL/min/{1.73_m2} (ref >59)
Globulin, Total: 2.6 g/dL (ref 1.5–4.5)
Glucose: 114 mg/dL — ABNORMAL HIGH (ref 65–99)
Potassium: 3.8 mmol/L (ref 3.5–5.2)
Sodium: 136 mmol/L (ref 134–144)
Total Protein: 7.2 g/dL (ref 6.0–8.5)

## 2017-12-22 LAB — HIV ANTIBODY (ROUTINE TESTING W REFLEX): HIV SCREEN 4TH GENERATION: NONREACTIVE

## 2017-12-22 LAB — TSH: TSH: 0.768 u[IU]/mL (ref 0.450–4.500)

## 2017-12-22 LAB — HEMOGLOBIN A1C
Est. average glucose Bld gHb Est-mCnc: 123 mg/dL
Hgb A1c MFr Bld: 5.9 % — ABNORMAL HIGH (ref 4.8–5.6)

## 2017-12-22 LAB — HEPATITIS C ANTIBODY: Hep C Virus Ab: <0.1 s/co ratio (ref 0.0–0.9)

## 2017-12-22 NOTE — Telephone Encounter (Signed)
-----   Message from Mar Daring, [place] sent at 12/22/2017  1:25 PM EDT ----- Platelets are lower than normal but report states platelets had aggregated (clotted) some in the tube and could falsely lower that level. I would recommend [place] recheck CBC in 1-2 weeks if agreeable. Other wise blood count stable. Kidney and liver function normal. A1c slightly higher than last year at 5.9. Limit sugars and carbs from diet. Cholesterol doing better than last year. Thyroid normal. Hep C negative and HIV negative.

## 2017-12-22 NOTE — Telephone Encounter (Signed)
Patient advised as directed below.  Thanks,  -Sherran Margolis 

## 2018-01-01 ENCOUNTER — Telehealth: Payer: Self-pay | Admitting: Urology

## 2018-01-01 NOTE — Telephone Encounter (Signed)
Patient tried to get his MRI scheduled and they will not do it because he had a total hip replacement on 08-08-2014. He wants to know what we need to do moving forward?   [name]

## 2018-01-03 NOTE — Telephone Encounter (Signed)
Would schedule at [facility]

## 2018-01-14 DIAGNOSIS — M818 Other osteoporosis without current pathological fracture: Secondary | ICD-10-CM | POA: Diagnosis not present

## 2018-01-14 DIAGNOSIS — R972 Elevated prostate specific antigen [PSA]: Secondary | ICD-10-CM | POA: Diagnosis not present

## 2018-01-15 ENCOUNTER — Other Ambulatory Visit: Payer: Self-pay | Admitting: Urology

## 2018-01-25 NOTE — Progress Notes (Signed)
Prostate MRI showed no suspicious lesions.  Recommend a follow-up PSA/DRE in 6 months.

## 2018-01-26 NOTE — Progress Notes (Signed)
Called pt, no answer LM for pt informing him of the information below. Advised pt to call back for questions or concerns.

## 2018-01-26 NOTE — Progress Notes (Signed)
Pt infromed via VM.

## 2018-04-12 ENCOUNTER — Other Ambulatory Visit: Payer: Self-pay | Admitting: Physician Assistant

## 2018-04-12 DIAGNOSIS — I10 Essential (primary) hypertension: Secondary | ICD-10-CM

## 2018-06-05 ENCOUNTER — Encounter: Payer: Self-pay | Admitting: Urology

## 2018-06-05 ENCOUNTER — Ambulatory Visit (INDEPENDENT_AMBULATORY_CARE_PROVIDER_SITE_OTHER): Payer: BLUE CROSS/BLUE SHIELD | Admitting: Urology

## 2018-06-05 VITALS — BP 145/81 | HR 108 | Ht 72.0 in | Wt 231.1 lb

## 2018-06-05 DIAGNOSIS — R972 Elevated prostate specific antigen [PSA]: Secondary | ICD-10-CM | POA: Diagnosis not present

## 2018-06-06 ENCOUNTER — Encounter: Payer: Self-pay | Admitting: Urology

## 2018-06-06 ENCOUNTER — Telehealth: Payer: Self-pay

## 2018-06-06 LAB — PSA: PROSTATE SPECIFIC AG, SERUM: 8.9 ng/mL — AB (ref 0.0–4.0)

## 2018-06-06 NOTE — Telephone Encounter (Signed)
Patient notified on vmail per DPR 

## 2018-06-06 NOTE — Progress Notes (Signed)
06/05/2018 7:33 AM   Chad Matthews [birth year] [id number]  Referring provider: Mar Daring, PA-C [place], [place] [address]  Chief Complaint  Patient presents with  . Elevated PSA   Urologic history: 1.  Elevated PSA  - Prostate biopsy 04/2017; PSA 7.7; prostate volume 79 g; benign pathology  - PSA June 2019 9.7; prostate MRI no suspicious lesions; volume 71 g  HPI: 58 year old male presents for follow-up of the above problem list.  He currently has no complaints.  Denies bothersome lower urinary tract symptoms.  Denies dysuria or gross hematuria.  He has no flank, abdominal, pelvic or scrotal pain.   PMH: Past Medical History:  Diagnosis Date  . GERD (gastroesophageal reflux disease)   . Hernia, hiatal   . Hypertension     Surgical History: Past Surgical History:  Procedure Laterality Date  . bicep surgery Right   . COLONOSCOPY WITH PROPOFOL N/A 08/27/2015   Procedure: COLONOSCOPY WITH PROPOFOL;  Surgeon: Manya Silvas, MD;  Location: [facility] ENDOSCOPY;  Service: Endoscopy;  Laterality: N/A;  . ELBOW SURGERY Left 1979  . HERNIA REPAIR  1969  . TOTAL HIP ARTHROPLASTY Right 08/08/2014   Procedure: RIGHT TOTAL HIP ARTHROPLASTY;  Surgeon: Earlie Server, MD;  Location: [facility];  Service: Orthopedics;  Laterality: Right;    Home Medications:  Allergies as of 06/05/2018      Reactions   Bee Venom Anaphylaxis   Lisinopril Cough      Medication List       Accurate as of June 05, 2018 11:59 PM. Always use your most recent med list.        amLODipine 5 MG tablet Commonly known as:  NORVASC Take 1 tablet (5 mg total) by mouth daily.   famotidine 20 MG tablet Commonly known as:  PEPCID Take 20 mg by mouth at bedtime.   triamterene-hydrochlorothiazide 37.5-25 MG tablet Commonly known as:  MAXZIDE-25 TAKE 1 TABLET BY MOUTH TWICE A DAY       Allergies:  Allergies  Allergen Reactions  . Bee Venom Anaphylaxis  .  Lisinopril Cough    Family History: Family History  Problem Relation Age of Onset  . Healthy Mother   . Cancer Father   . Stroke Father   . Transient ischemic attack Father   . Lung cancer Father   . Healthy Brother   . Healthy Brother   . Bladder Cancer Neg Hx   . Kidney cancer Neg Hx   . Prostate cancer Neg Hx     Social History:  reports that he quit smoking about 33 years ago. His smoking use included cigarettes. He has a 5.00 pack-year smoking history. He has never used smokeless tobacco. He reports current alcohol use of about 14.0 standard drinks of alcohol per week. He reports that he does not use drugs.  ROS: UROLOGY Frequent Urination?: No Hard to postpone urination?: No Burning/pain with urination?: No Get up at night to urinate?: No Leakage of urine?: No Urine stream starts and stops?: No Trouble starting stream?: No Do you have to strain to urinate?: No Blood in urine?: No Urinary tract infection?: No Sexually transmitted disease?: No Injury to kidneys or bladder?: No Painful intercourse?: No Weak stream?: No Erection problems?: No Penile pain?: No  Gastrointestinal Nausea?: No Vomiting?: No Indigestion/heartburn?: No Diarrhea?: No Constipation?: No  Constitutional Fever: No Night sweats?: No Weight loss?: No Fatigue?: No  Skin Skin rash/lesions?: No Itching?: No  Eyes Blurred vision?: No  Double vision?: No  Ears/Nose/Throat Sore throat?: No Sinus problems?: No  Hematologic/Lymphatic Swollen glands?: No Easy bruising?: No  Cardiovascular Leg swelling?: No Chest pain?: No  Respiratory Cough?: No Shortness of breath?: No  Endocrine Excessive thirst?: No  Musculoskeletal Back pain?: No Joint pain?: No  Neurological Headaches?: No Dizziness?: No  Psychologic Depression?: No Anxiety?: No  Physical Exam: BP (!) 145/81 (BP Location: Left Arm, Patient Position: Sitting, Cuff Size: Normal)   Pulse (!) 108   Ht 6' (1.829  m)   Wt 231 lb 1.6 oz (104.8 kg)   BMI 31.34 kg/m   Constitutional:  Alert and oriented, No acute distress. HEENT: [place] AT, moist mucus membranes.  Trachea midline, no masses. Cardiovascular: No clubbing, cyanosis, or edema. Respiratory: Normal respiratory effort, no increased work of breathing. GI: Abdomen is soft, nontender, nondistended, no abdominal masses GU: No CVA tenderness.  Prostate 60 g, smooth without nodules Lymph: No cervical or inguinal lymphadenopathy. Skin: No rashes, bruises or suspicious lesions. Neurologic: Grossly intact, no focal deficits, moving all 4 extremities. Psychiatric: Normal mood and affect.  Assessment & Plan:    1. Elevated PSA Recent prostate MRI showed no lesions suspicious for high-grade prostate cancer.  PSA was drawn today and if stable he will continue surveillance and a six-month follow-up.   Return in about 6 months (around 12/04/2018) for Recheck, PSA.   Abbie Sons, [place] [address] [place], [place] [address] [phone]

## 2018-06-06 NOTE — Telephone Encounter (Signed)
-----   Message from Abbie Sons, MD sent at 06/06/2018  9:44 AM EST ----- PSA stable 8.9.  Follow-up as scheduled

## 2018-06-25 ENCOUNTER — Encounter: Payer: Self-pay | Admitting: Physician Assistant

## 2018-06-25 ENCOUNTER — Ambulatory Visit: Payer: BLUE CROSS/BLUE SHIELD | Admitting: Physician Assistant

## 2018-06-25 VITALS — BP 137/89 | HR 93 | Temp 98.1°F | Wt 231.2 lb

## 2018-06-25 DIAGNOSIS — M10072 Idiopathic gout, left ankle and foot: Secondary | ICD-10-CM

## 2018-06-25 MED ORDER — PREDNISONE 10 MG (21) PO TBPK: ORAL_TABLET | ORAL | 0 refills | Status: DC

## 2018-06-25 NOTE — Progress Notes (Signed)
Patient: Chad Matthews Male    DOB: [birth year]   58 y.o.   MRN: [id number] Visit Date: 06/25/2018  Today's Provider: Mar Daring, PA-C   Chief Complaint  Patient presents with  . Foot Pain   Subjective:    HPI  Foot Pain Patient presents today for left foot pain and swelling. Patient states it happened 1 week ago. Patient states he was cleaning out a lobster tank and 12 hours later the pain occurred. Patient has been putting ice on the foot and has not taking any medication for the pain or swelling. Pain is located in the left great toe at the MTP joint. There is redness, swelling and warmth to the joint. He denies fevers, chills, nausea or vomiting. He has never had gout before. He does drink alcohol (3-4 beers) nightly. He also eats red meat often and will have shellfish seasonally.   Allergies  Allergen Reactions  . Bee Venom Anaphylaxis  . Lisinopril Cough     Current Outpatient Medications:  .  amLODipine (NORVASC) 5 MG tablet, Take 1 tablet (5 mg total) by mouth daily., Disp: 90 tablet, Rfl: 3 .  famotidine (PEPCID) 20 MG tablet, Take 20 mg by mouth at bedtime., Disp: , Rfl:  .  triamterene-hydrochlorothiazide (MAXZIDE-25) 37.5-25 MG tablet, TAKE 1 TABLET BY MOUTH TWICE A DAY, Disp: 180 tablet, Rfl: 1  Review of Systems  Constitutional: Negative for appetite change, chills and fever.  Respiratory: Negative for chest tightness, shortness of breath and wheezing.   Cardiovascular: Negative for chest pain and palpitations.  Gastrointestinal: Negative for abdominal pain, nausea and vomiting.  Musculoskeletal: Positive for arthralgias and joint swelling.  Skin: Positive for color change.    Social History   Tobacco Use  . Smoking status: Former Smoker    Packs/day: 0.50    Years: 10.00    Pack years: 5.00    Types: Cigarettes    Last attempt to quit: 07/25/1984    Years since quitting: 33.9  . Smokeless tobacco: Never Used  Substance Use Topics  .  Alcohol use: Yes    Alcohol/week: 14.0 standard drinks    Types: 14 Cans of beer per week    Comment: daily      Objective:   BP 137/89 (BP Location: Left Arm, Patient Position: Sitting, Cuff Size: Large)   Pulse 93   Temp 98.1 F (36.7 C) (Oral)   Wt 231 lb 3.2 oz (104.9 kg)   SpO2 96%   BMI 31.36 kg/m  Vitals:   06/25/18 1349  BP: 137/89  Pulse: 93  Temp: 98.1 F (36.7 C)  TempSrc: Oral  SpO2: 96%  Weight: 231 lb 3.2 oz (104.9 kg)     Physical Exam Vitals signs reviewed.  Constitutional:      General: He is not in acute distress.    Appearance: He is well-developed. He is not diaphoretic.  HENT:     Head: Normocephalic and atraumatic.  Neck:     Musculoskeletal: Normal range of motion and neck supple.     Thyroid: No thyromegaly.     Vascular: No JVD.     Trachea: No tracheal deviation.  Cardiovascular:     Rate and Rhythm: Normal rate and regular rhythm.     Heart sounds: Normal heart sounds. No murmur. No friction rub. No gallop.   Pulmonary:     Effort: Pulmonary effort is normal. No respiratory distress.     Breath sounds: Normal breath  sounds. No wheezing or rales.  Musculoskeletal:     Left foot: Normal range of motion and normal capillary refill. Tenderness and swelling present. No bony tenderness.       Feet:  Lymphadenopathy:     Cervical: No cervical adenopathy.         Assessment & Plan    1. Acute idiopathic gout involving toe of left foot Symptoms c/w gout in patient with risks factors. Will check labs as below to r/o renal involvement and elevated WBC count (r/o infection). Prednisone given as below for inflammation. May continue ice to the area. I will f/u pending labs. Call if no improvements.  - CBC w/Diff/Platelet - Uric acid - Basic Metabolic Panel (BMET) - predniSONE (STERAPRED UNI-PAK 21 TAB) 10 MG (21) TBPK tablet; 6 day taper; take as directed on package instructions  Dispense: 21 tablet; Refill: 0     Mar Daring,  PA-C  [facility]

## 2018-06-25 NOTE — Patient Instructions (Signed)
Gout    Gout is a condition that causes painful swelling of the joints. Gout is a type of inflammation of the joints (arthritis). This condition is caused by having too much uric acid in the body. Uric acid is a chemical that forms when the body breaks down substances called purines. Purines are important for building body proteins.  When the body has too much uric acid, sharp crystals can form and build up inside the joints. This causes pain and swelling. Gout attacks can happen quickly and may be very painful (acute gout). Over time, the attacks can affect more joints and become more frequent (chronic gout). Gout can also cause uric acid to build up under the skin and inside the kidneys.  What are the causes?  This condition is caused by too much uric acid in your blood. This can happen because:   Your kidneys do not remove enough uric acid from your blood. This is the most common cause.   Your body makes too much uric acid. This can happen with some cancers and cancer treatments. It can also occur if your body is breaking down too many red blood cells (hemolytic anemia).   You eat too many foods that are high in purines. These foods include organ meats and some seafood. Alcohol, especially beer, is also high in purines.  A gout attack may be triggered by trauma or stress.  What increases the risk?  You are more likely to develop this condition if you:   Have a family history of gout.   Are male and middle-aged.   Are male and have gone through menopause.   Are obese.   Frequently drink alcohol, especially beer.   Are dehydrated.   Lose weight too quickly.   Have an organ transplant.   Have lead poisoning.   Take certain medicines, including aspirin, cyclosporine, diuretics, levodopa, and niacin.   Have kidney disease.   Have a skin condition called psoriasis.  What are the signs or symptoms?  An attack of acute gout happens quickly. It usually occurs in just one joint. The most common place is  the big toe. Attacks often start at night. Other joints that may be affected include joints of the feet, ankle, knee, fingers, wrist, or elbow. Symptoms of this condition may include:   Severe pain.   Warmth.   Swelling.   Stiffness.   Tenderness. The affected joint may be very painful to touch.   Shiny, red, or purple skin.   Chills and fever.  Chronic gout may cause symptoms more frequently. More joints may be involved. You may also have white or yellow lumps (tophi) on your hands or feet or in other areas near your joints.  How is this diagnosed?  This condition is diagnosed based on your symptoms, medical history, and physical exam. You may have tests, such as:   Blood tests to measure uric acid levels.   Removal of joint fluid with a thin needle (aspiration) to look for uric acid crystals.   X-rays to look for joint damage.  How is this treated?  Treatment for this condition has two phases: treating an acute attack and preventing future attacks. Acute gout treatment may include medicines to reduce pain and swelling, including:   NSAIDs.   Steroids. These are strong anti-inflammatory medicines that can be taken by mouth (orally) or injected into a joint.   Colchicine. This medicine relieves pain and swelling when it is taken soon after an attack. It   can be given by mouth or through an IV.  Preventive treatment may include:   Daily use of smaller doses of NSAIDs or colchicine.   Use of a medicine that reduces uric acid levels in your blood.   Changes to your diet. You may need to see a dietitian about what to eat and drink to prevent gout.  Follow these instructions at home:  During a gout attack     If directed, put ice on the affected area:  ? Put ice in a plastic bag.  ? Place a towel between your skin and the bag.  ? Leave the ice on for 20 minutes, 2-3 times a day.   Raise (elevate) the affected joint above the level of your heart as often as possible.   Rest the joint as much as possible.  If the affected joint is in your leg, you may be given crutches to use.   Follow instructions from your health care provider about eating or drinking restrictions.  Avoiding future gout attacks   Follow a low-purine diet as told by your dietitian or health care provider. Avoid foods and drinks that are high in purines, including liver, kidney, anchovies, asparagus, herring, mushrooms, mussels, and beer.   Maintain a healthy weight or lose weight if you are overweight. If you want to lose weight, talk with your health care provider. It is important that you do not lose weight too quickly.   Start or maintain an exercise program as told by your health care provider.  Eating and drinking   Drink enough fluids to keep your urine pale yellow.   If you drink alcohol:  ? Limit how much you use to:   0-1 drink a day for women.   0-2 drinks a day for men.  ? Be aware of how much alcohol is in your drink. In the [place] one drink equals one 12 oz bottle of beer (355 mL) one 5 oz glass of wine (148 mL), or one 1 oz glass of hard liquor (44 mL).  General instructions   Take over-the-counter and prescription medicines only as told by your health care provider.   Do not drive or use heavy machinery while taking prescription pain medicine.   Return to your normal activities as told by your health care provider. Ask your health care provider what activities are safe for you.   Keep all follow-up visits as told by your health care provider. This is important.  Contact a health care provider if you have:   Another gout attack.   Continuing symptoms of a gout attack after 10 days of treatment.   Side effects from your medicines.   Chills or a fever.   Burning pain when you urinate.   Pain in your lower back or belly.  Get help right away if you:   Have severe or uncontrolled pain.   Cannot urinate.  Summary   Gout is painful swelling of the joints caused by inflammation.   The most common site of pain is the big  toe, but it can affect other joints in the body.   Medicines and dietary changes can help to prevent and treat gout attacks.  This information is not intended to replace advice given to you by your health care provider. Make sure you discuss any questions you have with your health care provider.  Document Released: 05/13/2000 Document Revised: 12/06/2017 Document Reviewed: 12/06/2017  Elsevier Interactive Patient Education  2019 [employer] Inc[employer]      Low-Purine Eating Plan  A low-purine eating plan involves making food choices to limit your intake of purine. Purine is a kind of uric acid. Too much uric acid in your blood can cause certain conditions, such as gout and kidney stones. Eating a low-purine diet can help control these conditions.  What are tips for following this plan?  Reading food labels     Avoid foods with saturated or Trans fat.   Check the ingredient list of grains-based foods, such as bread and cereal, to make sure that they contain whole grains.   Check the ingredient list of sauces or soups to make sure they do not contain meat or fish.   When choosing soft drinks, check the ingredient list to make sure they do not contain high-fructose corn syrup.  Shopping   Buy plenty of fresh fruits and vegetables.   Avoid buying canned or fresh fish.   Buy dairy products labeled as low-fat or nonfat.   Avoid buying premade or processed foods. These foods are often high in fat, salt (sodium), and added sugar.  Cooking   Use olive oil instead of butter when cooking. Oils like olive oil, canola oil, and sunflower oil contain healthy fats.  Meal planning   Learn which foods do or do not affect you. If you find out that a food tends to cause your gout symptoms to flare up, avoid eating that food. You can enjoy foods that do not cause problems. If you have any questions about a food item, talk with your dietitian or health care provider.   Limit foods high in fat, especially saturated fat. Fat makes it  harder for your body to get rid of uric acid.   Choose foods that are lower in fat and are lean sources of protein.  General guidelines   Limit alcohol intake to no more than 1 drink a day for nonpregnant women and 2 drinks a day for men. One drink equals 12 oz of beer, 5 oz of wine, or 1 oz of hard liquor. Alcohol can affect the way your body gets rid of uric acid.   Drink plenty of water to keep your urine clear or pale yellow. Fluids can help remove uric acid from your body.   If directed by your health care provider, take a vitamin C supplement.   Work with your health care provider and dietitian to develop a plan to achieve or maintain a healthy weight. Losing weight can help reduce uric acid in your blood.  What foods are recommended?  The items listed may not be a complete list. Talk with your dietitian about what dietary choices are best for you.  Foods low in purines  Foods low in purines do not need to be limited. These include:   All fruits.   All low-purine vegetables, pickles, and olives.   Breads, pasta, rice, cornbread, and popcorn. Cake and other baked goods.   All dairy foods.   Eggs, nuts, and nut butters.   Spices and condiments, such as salt, herbs, and vinegar.   Plant oils, butter, and margarine.   Water, sugar-free soft drinks, tea, coffee, and cocoa.   Vegetable-based soups, broths, sauces, and gravies.  Foods moderate in purines  Foods moderate in purines should be limited to the amounts listed.    cup of asparagus, cauliflower, spinach, mushrooms, or green peas, each day.   2/3 cup uncooked oatmeal, each day.    cup dry wheat bran or wheat germ, each day.     2-3 ounces of meat or poultry, each day.   4-6 ounces of shellfish, such as crab, lobster, oysters, or shrimp, each day.   1 cup cooked beans, peas, or lentils, each day.   Soup, broths, or bouillon made from meat or fish. Limit these foods as much as possible.  What foods are not recommended?  The items listed  may not be a complete list. Talk with your dietitian about what dietary choices are best for you.  Limit your intake of foods high in purines, including:   Beer and other alcohol.   Meat-based gravy or sauce.   Canned or fresh fish, such as:  ? Anchovies, sardines, herring, and tuna.  ? Mussels and scallops.  ? Codfish, trout, and haddock.   Bacon.   Organ meats, such as:  ? Liver or kidney.  ? Tripe.  ? Sweetbreads (thymus gland or pancreas).   Wild game or goose.   Yeast or yeast extract supplements.   Drinks sweetened with high-fructose corn syrup.  Summary   Eating a low-purine diet can help control conditions caused by too much uric acid in the body, such as gout or kidney stones.   Choose low-purine foods, limit alcohol, and limit foods high in fat.   You will learn over time which foods do or do not affect you. If you find out that a food tends to cause your gout symptoms to flare up, avoid eating that food.  This information is not intended to replace advice given to you by your health care provider. Make sure you discuss any questions you have with your health care provider.  Document Released: 09/10/2010 Document Revised: 06/29/2016 Document Reviewed: 06/29/2016  Elsevier Interactive Patient Education  2019 [employer]vier Inc.

## 2018-06-26 LAB — CBC WITH DIFFERENTIAL/PLATELET
BASOS ABS: 0 10*3/uL (ref 0.0–0.2)
Basos: 1 %
EOS (ABSOLUTE): 0.1 10*3/uL (ref 0.0–0.4)
EOS: 2 %
HEMATOCRIT: 39.7 % (ref 37.5–51.0)
HEMOGLOBIN: 13.9 g/dL (ref 13.0–17.7)
IMMATURE GRANS (ABS): 0 10*3/uL (ref 0.0–0.1)
Immature Granulocytes: 1 %
LYMPHS: 21 %
Lymphocytes Absolute: 1.3 10*3/uL (ref 0.7–3.1)
MCH: 33.7 pg — AB (ref 26.6–33.0)
MCHC: 35 g/dL (ref 31.5–35.7)
MCV: 96 fL (ref 79–97)
MONOCYTES: 8 %
Monocytes Absolute: 0.5 10*3/uL (ref 0.1–0.9)
NEUTROS ABS: 4.4 10*3/uL (ref 1.4–7.0)
Neutrophils: 67 %
Platelets: 157 10*3/uL (ref 150–450)
RBC: 4.12 x10E6/uL — ABNORMAL LOW (ref 4.14–5.80)
RDW: 11.9 % (ref 11.6–15.4)
WBC: 6.4 10*3/uL (ref 3.4–10.8)

## 2018-06-26 LAB — BASIC METABOLIC PANEL
BUN/Creatinine Ratio: 15 (ref 9–20)
BUN: 14 mg/dL (ref 6–24)
CHLORIDE: 95 mmol/L — AB (ref 96–106)
CO2: 23 mmol/L (ref 20–29)
Calcium: 9.5 mg/dL (ref 8.7–10.2)
Creatinine, Ser: 0.93 mg/dL (ref 0.76–1.27)
GFR calc Af Amer: 104 mL/min/{1.73_m2} (ref >59)
GFR calc non Af Amer: 90 mL/min/{1.73_m2} (ref >59)
Glucose: 105 mg/dL — ABNORMAL HIGH (ref 65–99)
Potassium: 3.6 mmol/L (ref 3.5–5.2)
Sodium: 136 mmol/L (ref 134–144)

## 2018-06-26 LAB — URIC ACID: URIC ACID: 8.5 mg/dL (ref 3.7–8.6)

## 2018-06-27 ENCOUNTER — Telehealth: Payer: Self-pay | Admitting: *Deleted

## 2018-06-27 NOTE — Telephone Encounter (Signed)
-----   Message from Mar Daring, [place] sent at 06/27/2018  7:43 AM EST ----- Blood count is normal, no signs of infection. Uric acid level high normal, still suspecting gout as cause. Kidney function is normal. Is toe improving?

## 2018-06-27 NOTE — Telephone Encounter (Signed)
Noted  

## 2018-06-27 NOTE — Telephone Encounter (Signed)
LMOVM for pt to return call 

## 2018-06-27 NOTE — Telephone Encounter (Signed)
Patient advised as directed below,. Patient reports that his toe is doing better.

## 2018-08-20 ENCOUNTER — Telehealth: Payer: Self-pay | Admitting: Physician Assistant

## 2018-08-20 NOTE — Telephone Encounter (Signed)
pt's wife called saying insurance changed their pharmacy to  [facility] # is [id number]  Thanks Con Memos

## 2018-08-22 ENCOUNTER — Telehealth: Payer: Self-pay

## 2018-08-22 DIAGNOSIS — M10072 Idiopathic gout, left ankle and foot: Secondary | ICD-10-CM

## 2018-08-22 MED ORDER — PREDNISONE 10 MG (21) PO TBPK: ORAL_TABLET | ORAL | 0 refills | Status: DC

## 2018-08-22 NOTE — Telephone Encounter (Signed)
Sent in for him

## 2018-08-22 NOTE — Telephone Encounter (Signed)
Pt states he is having a flare of gout in his left toe.  He would like prednisone sent to [facility].     Thanks,   -Mickel Baas

## 2018-09-20 ENCOUNTER — Telehealth: Payer: Self-pay

## 2018-09-20 DIAGNOSIS — M10072 Idiopathic gout, left ankle and foot: Secondary | ICD-10-CM

## 2018-09-20 MED ORDER — PREDNISONE 10 MG (21) PO TBPK: ORAL_TABLET | ORAL | 0 refills | Status: DC

## 2018-09-20 NOTE — Telephone Encounter (Signed)
Yes will refill

## 2018-09-20 NOTE — Telephone Encounter (Signed)
Patient calling that he has a gout flare up and if Tawanna Sat can send him a prescription to the pharmacy. Couldn't remember the name. Last office visit it looks like Prednisone was given.  Pharmacy: [facility].

## 2018-09-20 NOTE — Telephone Encounter (Signed)
Patient was advised.  

## 2018-11-13 ENCOUNTER — Other Ambulatory Visit: Payer: Self-pay | Admitting: Physician Assistant

## 2018-11-13 DIAGNOSIS — I10 Essential (primary) hypertension: Secondary | ICD-10-CM

## 2018-12-03 ENCOUNTER — Other Ambulatory Visit: Payer: Self-pay | Admitting: Family Medicine

## 2018-12-03 DIAGNOSIS — R972 Elevated prostate specific antigen [PSA]: Secondary | ICD-10-CM

## 2018-12-04 ENCOUNTER — Other Ambulatory Visit: Payer: Self-pay

## 2018-12-04 ENCOUNTER — Other Ambulatory Visit: Payer: BLUE CROSS/BLUE SHIELD

## 2018-12-04 DIAGNOSIS — R972 Elevated prostate specific antigen [PSA]: Secondary | ICD-10-CM | POA: Diagnosis not present

## 2018-12-05 LAB — PSA: Prostate Specific Ag, Serum: 9.4 ng/mL — ABNORMAL HIGH (ref 0.0–4.0)

## 2018-12-07 ENCOUNTER — Ambulatory Visit: Payer: BLUE CROSS/BLUE SHIELD | Admitting: Urology

## 2018-12-07 DIAGNOSIS — Z01812 Encounter for preprocedural laboratory examination: Secondary | ICD-10-CM | POA: Diagnosis not present

## 2018-12-07 DIAGNOSIS — K219 Gastro-esophageal reflux disease without esophagitis: Secondary | ICD-10-CM | POA: Diagnosis not present

## 2018-12-07 DIAGNOSIS — Z86010 Personal history of colonic polyps: Secondary | ICD-10-CM | POA: Diagnosis not present

## 2018-12-21 ENCOUNTER — Other Ambulatory Visit: Payer: Self-pay

## 2018-12-21 ENCOUNTER — Encounter: Payer: Self-pay | Admitting: Urology

## 2018-12-21 ENCOUNTER — Ambulatory Visit: Payer: BLUE CROSS/BLUE SHIELD | Admitting: Urology

## 2018-12-21 VITALS — BP 155/90 | HR 82 | Ht 72.0 in | Wt 230.0 lb

## 2018-12-21 DIAGNOSIS — R972 Elevated prostate specific antigen [PSA]: Secondary | ICD-10-CM

## 2018-12-21 DIAGNOSIS — N400 Enlarged prostate without lower urinary tract symptoms: Secondary | ICD-10-CM | POA: Diagnosis not present

## 2018-12-21 NOTE — Progress Notes (Signed)
12/21/2018 9:15 AM   Chad Matthews [id number]  Referring provider: Mar Daring, PA-C [address] [place],  [place] [address]  Chief Complaint  Patient presents with  . Elevated PSA    Urologic history: 1.  Elevated PSA             - Prostate biopsy 04/2017; PSA 7.7; prostate volume 79 g; benign pathology             - PSA June 2019 9.7; prostate MRI no suspicious lesions; volume 65 g  HPI: 58 year old male presents for follow-up of the above problem list.  He currently has no complaints.  Denies bothersome lower urinary tract symptoms.  Denies dysuria or gross hematuria.  He has no flank, abdominal, pelvic or scrotal pain.  PSA performed 12/04/2018 was stable at 9.4  PMH: Past Medical History:  Diagnosis Date  . GERD (gastroesophageal reflux disease)   . Hernia, hiatal   . Hypertension     Surgical History: Past Surgical History:  Procedure Laterality Date  . bicep surgery Right   . COLONOSCOPY WITH PROPOFOL N/A 08/27/2015   Procedure: COLONOSCOPY WITH PROPOFOL;  Surgeon: Manya Silvas, MD;  Location: [facility] ENDOSCOPY;  Service: Endoscopy;  Laterality: N/A;  . ELBOW SURGERY Left 1979  . HERNIA REPAIR  1969  . TOTAL HIP ARTHROPLASTY Right 08/08/2014   Procedure: RIGHT TOTAL HIP ARTHROPLASTY;  Surgeon: Earlie Server, MD;  Location: [facility];  Service: Orthopedics;  Laterality: Right;    Home Medications:  Allergies as of 12/21/2018      Reactions   Bee Venom Anaphylaxis   Lisinopril Cough      Medication List       Accurate as of December 21, 2018  9:15 AM. If you have any questions, ask your nurse or doctor.        STOP taking these medications   predniSONE 10 MG (21) Tbpk tablet Commonly known as: STERAPRED UNI-PAK 21 TAB Stopped by: Abbie Sons, MD     TAKE these medications   amLODipine 5 MG tablet Commonly known as: NORVASC TAKE 1 TABLET BY MOUTH EVERY DAY   famotidine 20 MG tablet Commonly known as: PEPCID  Take 20 mg by mouth at bedtime.   triamterene-hydrochlorothiazide 37.5-25 MG tablet Commonly known as: MAXZIDE-25 TAKE 1 TABLET BY MOUTH TWICE A DAY       Allergies:  Allergies  Allergen Reactions  . Bee Venom Anaphylaxis  . Lisinopril Cough    Family History: Family History  Problem Relation Age of Onset  . Healthy Mother   . Cancer Father   . Stroke Father   . Transient ischemic attack Father   . Lung cancer Father   . Healthy Brother   . Healthy Brother   . Bladder Cancer Neg Hx   . Kidney cancer Neg Hx   . Prostate cancer Neg Hx     Social History:  reports that he quit smoking about 34 years ago. His smoking use included cigarettes. He has a 5.00 pack-year smoking history. He has never used smokeless tobacco. He reports current alcohol use of about 14.0 standard drinks of alcohol per week. He reports that he does not use drugs.  ROS: UROLOGY Frequent Urination?: No Hard to postpone urination?: No Burning/pain with urination?: No Get up at night to urinate?: Yes Leakage of urine?: No Urine stream starts and stops?: No Trouble starting stream?: No Do you have to strain to urinate?: No Blood in  urine?: No Urinary tract infection?: No Sexually transmitted disease?: No Injury to kidneys or bladder?: No Painful intercourse?: No Weak stream?: No Erection problems?: No Penile pain?: No  Gastrointestinal Nausea?: No Vomiting?: No Indigestion/heartburn?: Yes Diarrhea?: No Constipation?: No  Constitutional Fever: No Night sweats?: Yes Weight loss?: No Fatigue?: No  Skin Skin rash/lesions?: No Itching?: No  Eyes Blurred vision?: No Double vision?: No  Ears/Nose/Throat Sore throat?: No Sinus problems?: No  Hematologic/Lymphatic Swollen glands?: No Easy bruising?: No  Cardiovascular Leg swelling?: No Chest pain?: No  Respiratory Cough?: No Shortness of breath?: No  Endocrine Excessive thirst?: No  Musculoskeletal Back pain?: No  Joint pain?: No  Neurological Headaches?: No Dizziness?: No  Psychologic Depression?: No Anxiety?: No  Physical Exam: BP (!) 155/90 (BP Location: Left Arm, Patient Position: Sitting, Cuff Size: Normal)   Pulse 82   Ht 6' (1.829 m)   Wt 230 lb (104.3 kg)   BMI 31.19 kg/m   Constitutional:  Alert and oriented, No acute distress. HEENT: Post AT, moist mucus membranes.  Trachea midline, no masses. Cardiovascular: No clubbing, cyanosis, or edema. Respiratory: Normal respiratory effort, no increased work of breathing. GI: Abdomen is soft, nontender, nondistended, no abdominal masses GU: No CVA tenderness.  Prostate 60 g, smooth without nodules Lymph: No cervical or inguinal lymphadenopathy. Skin: No rashes, bruises or suspicious lesions. Neurologic: Grossly intact, no focal deficits, moving all 4 extremities. Psychiatric: Normal mood and affect.   Assessment & Plan:   58 year old male with an elevated PSA which is stable.  Prostate MRI showed no lesions suspicious for high-grade prostate cancer.  He desires to continue surveillance and will follow-up in 6 months for a PSA and 1 year for PSA/DRE.  Abbie Sons, [place] [address], [place], [place] [address] [phone]

## 2018-12-25 ENCOUNTER — Ambulatory Visit (INDEPENDENT_AMBULATORY_CARE_PROVIDER_SITE_OTHER): Payer: BLUE CROSS/BLUE SHIELD | Admitting: Physician Assistant

## 2018-12-25 ENCOUNTER — Other Ambulatory Visit: Payer: Self-pay

## 2018-12-25 ENCOUNTER — Encounter: Payer: Self-pay | Admitting: Physician Assistant

## 2018-12-25 VITALS — BP 145/81 | HR 90 | Temp 98.9°F | Resp 16 | Ht 72.0 in | Wt 228.6 lb

## 2018-12-25 DIAGNOSIS — R739 Hyperglycemia, unspecified: Secondary | ICD-10-CM | POA: Diagnosis not present

## 2018-12-25 DIAGNOSIS — I10 Essential (primary) hypertension: Secondary | ICD-10-CM | POA: Diagnosis not present

## 2018-12-25 DIAGNOSIS — Z6831 Body mass index (BMI) 31.0-31.9, adult: Secondary | ICD-10-CM

## 2018-12-25 DIAGNOSIS — Z0000 Encounter for general adult medical examination without abnormal findings: Secondary | ICD-10-CM | POA: Diagnosis not present

## 2018-12-25 DIAGNOSIS — Z862 Personal history of diseases of the blood and blood-forming organs and certain disorders involving the immune mechanism: Secondary | ICD-10-CM | POA: Diagnosis not present

## 2018-12-25 DIAGNOSIS — E6609 Other obesity due to excess calories: Secondary | ICD-10-CM

## 2018-12-25 MED ORDER — TRIAMTERENE-HCTZ 37.5-25 MG PO TABS: 1.0000 | ORAL_TABLET | Freq: Two times a day (BID) | ORAL | 3 refills | Status: DC

## 2018-12-25 NOTE — Progress Notes (Signed)
Patient: Chad Matthews, Male    DOB: [birth year], 58 y.o.   MRN: [id number] Visit Date: 12/25/2018  Today's Provider: Mar Daring, PA-C   Chief Complaint  Patient presents with  . Annual Exam   Subjective:     Annual physical exam Chad Matthews is a 58 y.o. male who presents today for health maintenance and complete physical. He feels well. He reports exercising none. He reports he is sleeping well. ----------------------------------------------------------------- Colonoscopy: Scheduled 9/18 with [facility]. PSA: Being followed by Urology currently.  Review of Systems  Constitutional: Negative.   HENT: Positive for tinnitus.   Eyes: Negative.   Respiratory: Negative.   Cardiovascular: Negative.   Gastrointestinal: Negative.   Endocrine: Negative.   Genitourinary: Negative.   Musculoskeletal: Negative.   Skin: Negative.   Allergic/Immunologic: Negative.   Neurological: Negative.   Hematological: Negative.   Psychiatric/Behavioral: Negative.     Social History      He  reports that he quit smoking about 34 years ago. His smoking use included cigarettes. He has a 5.00 pack-year smoking history. He has never used smokeless tobacco. He reports current alcohol use of about 14.0 standard drinks of alcohol per week. He reports that he does not use drugs.       Social History   Socioeconomic History  . Marital status: Married    Spouse name: Hilda Blades  . Number of children: Not on file  . Years of education: College  . Highest education level: Not on file  Occupational History  . Occupation: Armed forces technical officer    Comment: [employer]  . Financial resource strain: Not on file  . Food insecurity    Worry: Not on file    Inability: Not on file  . Transportation needs    Medical: Not on file    Non-medical: Not on file  Tobacco Use  . Smoking status: Former Smoker    Packs/day: 0.50    Years: 10.00    Pack years: 5.00    Types: Cigarettes     Quit date: 07/25/1984    Years since quitting: 34.4  . Smokeless tobacco: Never Used  Substance and Sexual Activity  . Alcohol use: Yes    Alcohol/week: 14.0 standard drinks    Types: 14 Cans of beer per week    Comment: daily  . Drug use: No  . Sexual activity: Yes    Birth control/protection: None  Lifestyle  . Physical activity    Days per week: Not on file    Minutes per session: Not on file  . Stress: Not on file  Relationships  . Social Herbalist on phone: Not on file    Gets together: Not on file    Attends religious service: Not on file    Active member of club or organization: Not on file    Attends meetings of clubs or organizations: Not on file    Relationship status: Not on file  Other Topics Concern  . Not on file  Social History Narrative  . Not on file    Past Medical History:  Diagnosis Date  . GERD (gastroesophageal reflux disease)   . Hernia, hiatal   . Hypertension      Patient Active Problem List   Diagnosis Date Noted  . Elevated PSA 12/21/2018  . Benign prostatic hyperplasia without lower urinary tract symptoms 12/21/2018  . Thrombocytopenia ([place]) 12/21/2017  . History of smoking 12/19/2016  .  AA (alcohol abuse) 10/30/2014  . Accumulation of fluid in tissues 10/30/2014  . 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 Arthralgia of hip 10/30/2014  . Blood glucose elevated 10/30/2014  . LBP (low back pain) 10/30/2014  . Buzzing in ear 10/30/2014  . GERD (gastroesophageal reflux disease)   . Primary localized osteoarthritis of right hip 08/08/2014  . Essential hypertension 07/18/2014    Past Surgical History:  Procedure Laterality Date  . bicep surgery Right   . COLONOSCOPY WITH PROPOFOL N/A 08/27/2015   Procedure: COLONOSCOPY WITH PROPOFOL;  Surgeon: Manya Silvas, MD;  Location: [facility] ENDOSCOPY;  Service: Endoscopy;  Laterality: N/A;  . ELBOW SURGERY Left 1979  . HERNIA REPAIR  1969  . TOTAL HIP ARTHROPLASTY Right 08/08/2014    Procedure: RIGHT TOTAL HIP ARTHROPLASTY;  Surgeon: Earlie Server, MD;  Location: [facility];  Service: Orthopedics;  Laterality: Right;    Family History        Family Status  Relation Name Status  . Mother  Alive  . Father  Deceased at age 55  . Brother  Alive  . Brother  Alive  . Neg Hx  (Not Specified)        His family history includes Cancer in his father; Healthy in his brother, brother, and mother; Lung cancer in his father; Stroke in his father; Transient ischemic attack in his father. There is no history of Bladder Cancer, Kidney cancer, or Prostate cancer.      Allergies  Allergen Reactions  . Bee Venom Anaphylaxis  . Lisinopril Cough     Current Outpatient Medications:  .  amLODipine (NORVASC) 5 MG tablet, TAKE 1 TABLET BY MOUTH EVERY DAY, Disp: 90 tablet, Rfl: 3 .  famotidine (PEPCID) 20 MG tablet, Take 20 mg by mouth at bedtime. , Disp: , Rfl:  .  triamterene-hydrochlorothiazide (MAXZIDE-25) 37.5-25 MG tablet, TAKE 1 TABLET BY MOUTH TWICE A DAY, Disp: 180 tablet, Rfl: 1   Patient Care Team: Rubye Beach as PCP - General (Family Medicine)    Objective:    Vitals: BP (!) 145/81 (BP Location: Left Arm, Patient Position: Sitting, Cuff Size: Large)   Pulse 90   Temp 98.9 F (37.2 C) (Oral)   Resp 16   Ht 6' (1.829 m)   Wt 228 lb 9.6 oz (103.7 kg)   BMI 31.00 kg/m    Vitals:   12/25/18 1409  BP: (!) 145/81  Pulse: 90  Resp: 16  Temp: 98.9 F (37.2 C)  TempSrc: Oral  Weight: 228 lb 9.6 oz (103.7 kg)  Height: 6' (1.829 m)     Physical Exam Constitutional:      Appearance: Normal appearance. He is well-developed. He is obese.  HENT:     Head: Normocephalic and atraumatic.     Right Ear: Tympanic membrane, ear canal and external ear normal.     Left Ear: Tympanic membrane, ear canal and external ear normal.     Nose: Nose normal.     Mouth/Throat:     Mouth: Mucous membranes are moist.     Pharynx: Oropharynx is clear. No posterior  oropharyngeal erythema.  Eyes:     General: No scleral icterus.       Right eye: No discharge.        Left eye: No discharge.     Extraocular Movements: Extraocular movements intact.     Conjunctiva/sclera: Conjunctivae normal.     Pupils: Pupils are equal, round, and reactive to light.  Neck:  Musculoskeletal: Normal range of motion and neck supple.     Thyroid: No thyromegaly.     Vascular: No carotid bruit.     Trachea: No tracheal deviation.  Cardiovascular:     Rate and Rhythm: Normal rate and regular rhythm.     Pulses: Normal pulses.     Heart sounds: Normal heart sounds. No murmur.  Pulmonary:     Effort: Pulmonary effort is normal. No respiratory distress.     Breath sounds: Normal breath sounds. No wheezing or rales.  Chest:     Chest wall: No tenderness.  Abdominal:     General: Bowel sounds are normal. There is no distension.     Palpations: Abdomen is soft. There is no mass.     Tenderness: There is no abdominal tenderness. There is no guarding or rebound.  Genitourinary:    Comments: Deferred to Urology Musculoskeletal: Normal range of motion.        General: No tenderness.     Right lower leg: No edema.     Left lower leg: No edema.  Lymphadenopathy:     Cervical: No cervical adenopathy.  Skin:    General: Skin is warm and dry.     Capillary Refill: Capillary refill takes less than 2 seconds.     Findings: No erythema or rash.  Neurological:     General: No focal deficit present.     Mental Status: He is alert and oriented to person, place, and time. Mental status is at baseline.     Cranial Nerves: No cranial nerve deficit.     Motor: No weakness or abnormal muscle tone.     Coordination: Coordination normal.     Gait: Gait normal.     Deep Tendon Reflexes: Reflexes are normal and symmetric. Reflexes normal.  Psychiatric:        Mood and Affect: Mood normal.        Behavior: Behavior normal.        Thought Content: Thought content normal.         Judgment: Judgment normal.      Depression Screen PHQ 2/9 Scores 12/25/2018 12/21/2017 12/19/2016 06/10/2015  PHQ - 2 Score 0 0 0 0  PHQ- 9 Score - - 0 -       Assessment & Plan:     Routine Health Maintenance and Physical Exam  Exercise Activities and Dietary recommendations Goals    . Exercise 150 minutes per week (moderate activity)    . Reduce alcohol intake to 1 to 2 servings per day       Immunization History  Administered Date(s) Administered  . Td 12/13/2011  . Tdap 06/01/2016    Health Maintenance  Topic Date Due  . COLONOSCOPY  08/27/2018  . INFLUENZA VACCINE  12/29/2018  . TETANUS/TDAP  06/01/2026  . Hepatitis C Screening  Completed  . HIV Screening  Completed     Discussed health benefits of physical activity, and encouraged him to engage in regular exercise appropriate for his age and condition.    1. Annual physical exam Normal physical exam today. Will check labs as below and f/u pending lab results. If labs are stable and WNL he will not need to have these rechecked for one year at his next annual physical exam. He is to call the office in the meantime if he has any acute issue, questions or concerns. - CBC w/Diff/Platelet - Comprehensive Metabolic Panel (CMET) - TSH - Lipid Profile - HgB A1c  2.  Essential hypertension Stable. Continue Maxzide 37.5-25mg  and amlodipine 5mg . Will check labs as below and f/u pending results. - CBC w/Diff/Platelet - Comprehensive Metabolic Panel (CMET) - TSH - Lipid Profile - HgB A1c - triamterene-hydrochlorothiazide (MAXZIDE-25) 37.5-25 MG tablet; Take 1 tablet by mouth 2 (two) times daily.  Dispense: 180 tablet; Refill: 3  3. Blood glucose elevated H/O this. Diet controlled. Does have family history of diabetes. Will check labs as below and f/u pending results. - CBC w/Diff/Platelet - Comprehensive Metabolic Panel (CMET) - Lipid Profile - HgB A1c  4. History of thrombocytopenia Has been stable. Will check  labs as below and f/u pending results. - CBC w/Diff/Platelet - Comprehensive Metabolic Panel (CMET)  5. Class 1 obesity due to excess calories with serious comorbidity and body mass index (BMI) of 31.0 to 31.9 in adult Counseled patient on healthy lifestyle modifications including dieting and exercise.   --------------------------------------------------------------------    Mar Daring, PA-C  [facility]

## 2018-12-25 NOTE — Patient Instructions (Signed)

## 2018-12-31 DIAGNOSIS — I10 Essential (primary) hypertension: Secondary | ICD-10-CM | POA: Diagnosis not present

## 2018-12-31 DIAGNOSIS — Z862 Personal history of diseases of the blood and blood-forming organs and certain disorders involving the immune mechanism: Secondary | ICD-10-CM | POA: Diagnosis not present

## 2018-12-31 DIAGNOSIS — R739 Hyperglycemia, unspecified: Secondary | ICD-10-CM | POA: Diagnosis not present

## 2018-12-31 DIAGNOSIS — Z0000 Encounter for general adult medical examination without abnormal findings: Secondary | ICD-10-CM | POA: Diagnosis not present

## 2019-01-01 ENCOUNTER — Telehealth: Payer: Self-pay

## 2019-01-01 LAB — COMPREHENSIVE METABOLIC PANEL
ALT: 22 IU/L (ref 0–44)
AST: 28 IU/L (ref 0–40)
Albumin/Globulin Ratio: 1.8 (ref 1.2–2.2)
Albumin: 4.6 g/dL (ref 3.8–4.9)
Alkaline Phosphatase: 108 IU/L (ref 39–117)
BUN/Creatinine Ratio: 13 (ref 9–20)
BUN: 12 mg/dL (ref 6–24)
Bilirubin Total: 0.8 mg/dL (ref 0.0–1.2)
CO2: 25 mmol/L (ref 20–29)
Calcium: 9.4 mg/dL (ref 8.7–10.2)
Chloride: 95 mmol/L — ABNORMAL LOW (ref 96–106)
Creatinine, Ser: 0.95 mg/dL (ref 0.76–1.27)
GFR calc Af Amer: 101 mL/min/{1.73_m2} (ref >59)
GFR calc non Af Amer: 87 mL/min/{1.73_m2} (ref >59)
Globulin, Total: 2.6 g/dL (ref 1.5–4.5)
Glucose: 141 mg/dL — ABNORMAL HIGH (ref 65–99)
Potassium: 4.3 mmol/L (ref 3.5–5.2)
Sodium: 137 mmol/L (ref 134–144)
Total Protein: 7.2 g/dL (ref 6.0–8.5)

## 2019-01-01 LAB — CBC WITH DIFFERENTIAL/PLATELET
Basophils Absolute: 0 10*3/uL (ref 0.0–0.2)
Basos: 1 %
EOS (ABSOLUTE): 0.2 10*3/uL (ref 0.0–0.4)
Eos: 4 %
Hematocrit: 45 % (ref 37.5–51.0)
Hemoglobin: 15.6 g/dL (ref 13.0–17.7)
Immature Grans (Abs): 0 10*3/uL (ref 0.0–0.1)
Immature Granulocytes: 0 %
Lymphocytes Absolute: 1.1 10*3/uL (ref 0.7–3.1)
Lymphs: 24 %
MCH: 34.4 pg — ABNORMAL HIGH (ref 26.6–33.0)
MCHC: 34.7 g/dL (ref 31.5–35.7)
MCV: 99 fL — ABNORMAL HIGH (ref 79–97)
Monocytes Absolute: 0.4 10*3/uL (ref 0.1–0.9)
Monocytes: 9 %
Neutrophils Absolute: 2.9 10*3/uL (ref 1.4–7.0)
Neutrophils: 62 %
Platelets: 106 10*3/uL — ABNORMAL LOW (ref 150–450)
RBC: 4.54 x10E6/uL (ref 4.14–5.80)
RDW: 12.1 % (ref 11.6–15.4)
WBC: 4.7 10*3/uL (ref 3.4–10.8)

## 2019-01-01 LAB — LIPID PANEL
Chol/HDL Ratio: 4.6 ratio (ref 0.0–5.0)
Cholesterol, Total: 174 mg/dL (ref 100–199)
HDL: 38 mg/dL — ABNORMAL LOW (ref >39)
LDL Calculated: 95 mg/dL (ref 0–99)
Triglycerides: 207 mg/dL — ABNORMAL HIGH (ref 0–149)
VLDL Cholesterol Cal: 41 mg/dL — ABNORMAL HIGH (ref 5–40)

## 2019-01-01 LAB — HEMOGLOBIN A1C
Est. average glucose Bld gHb Est-mCnc: 131 mg/dL
Hgb A1c MFr Bld: 6.2 % — ABNORMAL HIGH (ref 4.8–5.6)

## 2019-01-01 LAB — TSH: TSH: 1.1 u[IU]/mL (ref 0.450–4.500)

## 2019-01-01 NOTE — Telephone Encounter (Signed)
-----   Message from Mar Daring, [place] sent at 01/01/2019  1:57 PM EDT ----- Blood count is normal/stable. Kidney and liver function are normal. Sodium, potassium and calcium are normal. Thyroid is normal. Cholesterol stable. A1c increased to 6.2 from 5.9. Limit carbohydrates and sugars from diet.

## 2019-01-01 NOTE — Telephone Encounter (Signed)
Patient was advised and states he is eating a lot of little debbie cake due to short work staff and he will get his A1C under control.FYI

## 2019-02-11 DIAGNOSIS — Z01812 Encounter for preprocedural laboratory examination: Secondary | ICD-10-CM | POA: Diagnosis not present

## 2019-02-11 DIAGNOSIS — J069 Acute upper respiratory infection, unspecified: Secondary | ICD-10-CM | POA: Diagnosis not present

## 2019-02-11 DIAGNOSIS — Z86010 Personal history of colonic polyps: Secondary | ICD-10-CM | POA: Diagnosis not present

## 2019-02-15 ENCOUNTER — Encounter: Payer: Self-pay | Admitting: Physician Assistant

## 2019-02-15 DIAGNOSIS — I10 Essential (primary) hypertension: Secondary | ICD-10-CM | POA: Diagnosis not present

## 2019-02-15 DIAGNOSIS — D125 Benign neoplasm of sigmoid colon: Secondary | ICD-10-CM | POA: Diagnosis not present

## 2019-02-15 DIAGNOSIS — K635 Polyp of colon: Secondary | ICD-10-CM | POA: Diagnosis not present

## 2019-02-15 DIAGNOSIS — Z86010 Personal history of colonic polyps: Secondary | ICD-10-CM | POA: Diagnosis not present

## 2019-02-15 LAB — HM COLONOSCOPY

## 2019-02-27 ENCOUNTER — Ambulatory Visit (INDEPENDENT_AMBULATORY_CARE_PROVIDER_SITE_OTHER): Payer: BLUE CROSS/BLUE SHIELD

## 2019-02-27 ENCOUNTER — Other Ambulatory Visit: Payer: Self-pay

## 2019-02-27 DIAGNOSIS — Z23 Encounter for immunization: Secondary | ICD-10-CM | POA: Diagnosis not present

## 2019-03-14 ENCOUNTER — Encounter: Payer: Self-pay | Admitting: Physician Assistant

## 2019-04-16 ENCOUNTER — Other Ambulatory Visit: Payer: Self-pay | Admitting: Physician Assistant

## 2019-04-16 DIAGNOSIS — I10 Essential (primary) hypertension: Secondary | ICD-10-CM

## 2019-04-16 MED ORDER — TRIAMTERENE-HCTZ 37.5-25 MG PO TABS: 1.0000 | ORAL_TABLET | Freq: Two times a day (BID) | ORAL | 3 refills | Status: DC

## 2019-04-19 ENCOUNTER — Telehealth: Payer: Self-pay | Admitting: Urology

## 2019-04-19 NOTE — Telephone Encounter (Signed)
Patient's wife called to inquiry about a bill and I checked into it and the bill is from 03-14-2017 $260.00 billed Ins W/O $102.62 Pt paid $50.00 co pay Bal of $107.38 was pt's ded that the pt owed This was sent to collections  Patient is responsible for this bill  LM for pt to cb    [place]

## 2019-04-23 ENCOUNTER — Telehealth: Payer: Self-pay | Admitting: Urology

## 2019-04-23 NOTE — Telephone Encounter (Signed)
Just spoke with the patient and his wife and they were in agreement that they did owe this and would pay it.   [name]

## 2019-06-26 ENCOUNTER — Other Ambulatory Visit: Payer: Self-pay

## 2019-06-26 DIAGNOSIS — R972 Elevated prostate specific antigen [PSA]: Secondary | ICD-10-CM

## 2019-06-26 DIAGNOSIS — N400 Enlarged prostate without lower urinary tract symptoms: Secondary | ICD-10-CM

## 2019-06-27 ENCOUNTER — Other Ambulatory Visit: Payer: BLUE CROSS/BLUE SHIELD

## 2019-06-27 ENCOUNTER — Other Ambulatory Visit: Payer: Self-pay

## 2019-06-27 DIAGNOSIS — R972 Elevated prostate specific antigen [PSA]: Secondary | ICD-10-CM | POA: Diagnosis not present

## 2019-06-27 DIAGNOSIS — N400 Enlarged prostate without lower urinary tract symptoms: Secondary | ICD-10-CM | POA: Diagnosis not present

## 2019-06-28 ENCOUNTER — Telehealth: Payer: Self-pay

## 2019-06-28 LAB — PSA: Prostate Specific Ag, Serum: 10.2 ng/mL — ABNORMAL HIGH (ref 0.0–4.0)

## 2019-06-28 NOTE — Telephone Encounter (Signed)
Patient notified

## 2019-06-28 NOTE — Telephone Encounter (Signed)
-----   Message from Abbie Sons, MD sent at 06/28/2019  8:53 AM EST ----- PSA slightly higher but not significantly.  Keep 54-month follow-up

## 2019-11-20 ENCOUNTER — Other Ambulatory Visit: Payer: Self-pay | Admitting: Physician Assistant

## 2019-11-20 DIAGNOSIS — I10 Essential (primary) hypertension: Secondary | ICD-10-CM

## 2019-11-20 MED ORDER — AMLODIPINE BESYLATE 5 MG PO TABS: 5.0000 mg | ORAL_TABLET | Freq: Every day | ORAL | 0 refills | Status: DC

## 2019-11-20 NOTE — Telephone Encounter (Signed)
amLODipine (NORVASC) 5 MG tablet Medication Date: 11/14/2018 Department: [place] Ordering/Authorizing: Mar Daring, PA-C   [facility] ([facility] [place], [place] - [address] Phone:  [phone]  Fax:  [phone]     Pt needs refill CPE is sch for 01/09/20, labs done 06/27/19 Pt was wanting refill as CPE forthcoming. pls notify pt if sooner appt [facility]

## 2019-11-20 NOTE — Telephone Encounter (Signed)
Patient was given 1 year supply by PCP- appt 8/12- #50 until appointment

## 2019-12-20 ENCOUNTER — Other Ambulatory Visit: Payer: Self-pay

## 2019-12-20 DIAGNOSIS — R972 Elevated prostate specific antigen [PSA]: Secondary | ICD-10-CM

## 2019-12-20 DIAGNOSIS — N400 Enlarged prostate without lower urinary tract symptoms: Secondary | ICD-10-CM

## 2019-12-23 ENCOUNTER — Other Ambulatory Visit: Payer: BLUE CROSS/BLUE SHIELD

## 2019-12-23 ENCOUNTER — Other Ambulatory Visit: Payer: Self-pay

## 2019-12-23 DIAGNOSIS — N400 Enlarged prostate without lower urinary tract symptoms: Secondary | ICD-10-CM | POA: Diagnosis not present

## 2019-12-23 DIAGNOSIS — R972 Elevated prostate specific antigen [PSA]: Secondary | ICD-10-CM | POA: Diagnosis not present

## 2019-12-24 LAB — PSA: Prostate Specific Ag, Serum: 13.7 ng/mL — ABNORMAL HIGH (ref 0.0–4.0)

## 2019-12-27 ENCOUNTER — Other Ambulatory Visit: Payer: Self-pay

## 2019-12-27 ENCOUNTER — Ambulatory Visit (INDEPENDENT_AMBULATORY_CARE_PROVIDER_SITE_OTHER): Payer: BLUE CROSS/BLUE SHIELD | Admitting: Urology

## 2019-12-27 ENCOUNTER — Encounter: Payer: Self-pay | Admitting: Urology

## 2019-12-27 VITALS — BP 157/86 | HR 78 | Ht 72.0 in | Wt 230.0 lb

## 2019-12-27 DIAGNOSIS — N400 Enlarged prostate without lower urinary tract symptoms: Secondary | ICD-10-CM | POA: Diagnosis not present

## 2019-12-27 DIAGNOSIS — R972 Elevated prostate specific antigen [PSA]: Secondary | ICD-10-CM

## 2019-12-27 MED ORDER — TAMSULOSIN HCL 0.4 MG PO CAPS
0.4000 mg | ORAL_CAPSULE | Freq: Every day | ORAL | 1 refills | Status: DC
Start: 2019-12-27 — End: 2020-01-20

## 2019-12-27 NOTE — Progress Notes (Signed)
12/27/2019 9:32 AM   Chad Matthews [birth year] [id number]  Referring provider: Mar Daring, PA-C [address] [place],  [place] [address]  Chief Complaint  Patient presents with  . Benign Prostatic Hypertrophy    Urologic history: 1.Elevated PSA -Prostate biopsy 04/2017; PSA 7.7; prostate volume 79 g; benign pathology - PSAJune 2019 9.7; prostate MRI no suspicious lesions; volume 105 g  HPI: 59 y.o. male presents for annual follow-up.   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 GERD (gastroesophageal reflux disease)   . Hernia, hiatal   . Hypertension     Surgical History: Past Surgical History:  Procedure Laterality Date  . bicep surgery Right   . COLONOSCOPY WITH PROPOFOL N/A 08/27/2015   Procedure: COLONOSCOPY WITH PROPOFOL;  Surgeon: Manya Silvas, MD;  Location: [facility] ENDOSCOPY;  Service: Endoscopy;  Laterality: N/A;  . ELBOW SURGERY Left 1979  . HERNIA REPAIR  1969  . TOTAL HIP ARTHROPLASTY Right 08/08/2014   Procedure: RIGHT TOTAL HIP ARTHROPLASTY;  Surgeon: Earlie Server, MD;  Location: [facility];  Service: Orthopedics;  Laterality: Right;    Home Medications:  Allergies as of 12/27/2019      Reactions   Bee Venom Anaphylaxis   Lisinopril Cough      Medication List       Accurate as of December 27, 2019  9:32 AM. If you have any questions, ask your nurse or doctor.        amLODipine 5 MG tablet Commonly known as: NORVASC Take 1 tablet (5 mg total) by mouth daily.   famotidine 20 MG tablet Commonly known as: PEPCID Take 20 mg by mouth at bedtime.   triamterene-hydrochlorothiazide 37.5-25 MG tablet Commonly known as: MAXZIDE-25 Take  1 tablet by mouth 2 (two) times daily.       Allergies:  Allergies  Allergen Reactions  . Bee Venom Anaphylaxis  . Lisinopril Cough    Family History: Family History  Problem Relation Age of Onset  . Healthy Mother   . Cancer Father   . Stroke Father   . Transient ischemic attack Father   . Lung cancer Father   . Healthy Brother   . Healthy Brother   . Bladder Cancer Neg Hx   . Kidney cancer Neg Hx   . Prostate cancer Neg Hx     Social History:  reports that he quit smoking about 35 years ago. His smoking use included cigarettes. He has a 5.00 pack-year smoking history. He has never used smokeless tobacco. He reports current alcohol use of about 14.0 standard drinks of alcohol per week. He reports that he does not use drugs.   Physical Exam: BP (!) 157/86   Pulse 78   Ht 6' (1.829 m)   Wt (!) 230 lb (104.3 kg)   BMI 31.19 kg/m   Constitutional:  Alert and oriented, No acute distress. HEENT: [place] AT, moist mucus membranes.  Trachea midline, no masses. Cardiovascular: No clubbing, cyanosis, or edema. Respiratory: Normal respiratory effort, no increased work of breathing. GU: Prostate 100+ grams, smooth without nodules Skin: No rashes, bruises or suspicious lesions. Neurologic: Grossly intact, no focal deficits, moving all 4 extremities. Psychiatric: Normal  mood and affect.   Assessment & Plan:    1.  Elevated PSA  Increased PSA above baseline.  Based on prostate size this could be a transient elevation related to inflammation.  He has also been driving significant distances over the last month  We discussed options of repeating the PSA in 6 weeks; 4K score; repeat prostate MRI and prostate biopsy  He has elected to repeat his PSA in 6 weeks which is reasonable.  Will send in Rx tamsulosin 0.4 mg daily X weeks prior to repeat [place], MD  [facility] [address] [place], [place] [address] [phone]

## 2020-01-05 ENCOUNTER — Encounter: Payer: Self-pay | Admitting: Urology

## 2020-01-07 NOTE — Progress Notes (Signed)
Complete physical exam   Patient: Chad Matthews   DOB: [birth year]   59 y.o. Male  MRN: [id number] Visit Date: 01/09/2020  Today's healthcare provider: Mar Daring, PA-C   Chief Complaint  Patient presents with  . Annual Exam   Subjective    Chad Matthews is a 59 y.o. male who presents today for a complete physical exam.  He reports consuming a Well balanced diet. The patient does not participate in regular exercise at present. He generally feels well. He reports sleeping well. He does have additional problems to discuss today.  HPI  Reports that after taking the Tamsulosin 0.4 mg he gets this headache the top of his head. Only last for about 30 minutes and then he goes to bed. Not affecting his sleep. Does not awaken with the headache. Tamsulosin is helping nocturia (nocturia down to 1 time instead of 4-5), he is sleeping better, and daytime frequency and urgency has decreased as well.   Past Medical History:  Diagnosis Date  . GERD (gastroesophageal reflux disease)   . Hernia, hiatal   . Hypertension    Past Surgical History:  Procedure Laterality Date  . bicep surgery Right   . COLONOSCOPY WITH PROPOFOL N/A 08/27/2015   Procedure: COLONOSCOPY WITH PROPOFOL;  Surgeon: Manya Silvas, MD;  Location: [facility] ENDOSCOPY;  Service: Endoscopy;  Laterality: N/A;  . ELBOW SURGERY Left 1979  . HERNIA REPAIR  1969  . TOTAL HIP ARTHROPLASTY Right 08/08/2014   Procedure: RIGHT TOTAL HIP ARTHROPLASTY;  Surgeon: Earlie Server, MD;  Location: [place];  Service: Orthopedics;  Laterality: Right;   Social History   Socioeconomic History  . Marital status: Married    Spouse name: Hilda Blades  . Number of children: Not on file  . Years of education: College  . Highest education level: Not on file  Occupational History  . Occupation: Armed forces technical officer    Comment: Full-Time  Tobacco Use  . Smoking status: Former Smoker    Packs/day: 0.50    Years: 10.00    Pack years: 5.00     Types: Cigarettes    Quit date: 07/25/1984    Years since quitting: 35.4  . Smokeless tobacco: Never Used  Vaping Use  . Vaping Use: Never used  Substance and Sexual Activity  . Alcohol use: Yes    Alcohol/week: 14.0 standard drinks    Types: 14 Cans of beer per week    Comment: daily  . Drug use: No  . Sexual activity: Yes    Birth control/protection: None  Other Topics Concern  . Not on file  Social History Narrative  . Not on file   Social Determinants of Health   Financial Resource Strain:   . Difficulty of Paying Living Expenses:   Food Insecurity:   . Worried About Charity fundraiser in the Last Year:   . Arboriculturist in the Last Year:   Transportation Needs:   . Film/video editor (Medical):   Marland Kitchen Lack of Transportation (Non-Medical):   Physical Activity:   . Days of Exercise per Week:   . Minutes of Exercise per Session:   Stress:   . Feeling of Stress :   Social Connections:   . Frequency of Communication with Friends and Family:   . Frequency of Social Gatherings with Friends and Family:   . Attends Religious Services:   . Active Member of Clubs or Organizations:   . Attends Archivist  Meetings:   Marland Kitchen Marital Status:   Intimate Partner Violence:   . Fear of Current or Ex-Partner:   . Emotionally Abused:   Marland Kitchen Physically Abused:   . Sexually Abused:    Family Status  Relation Name Status  . Mother  Alive  . Father  Deceased at age 64  . Brother  Alive  . Brother  Alive  . Neg Hx  (Not Specified)   Family History  Problem Relation Age of Onset  . Healthy Mother   . Cancer Father   . Stroke Father   . Transient ischemic attack Father   . Lung cancer Father   . Healthy Brother   . Healthy Brother   . Bladder Cancer Neg Hx   . Kidney cancer Neg Hx   . Prostate cancer Neg Hx    Allergies  Allergen Reactions  . Bee Venom Anaphylaxis  . Lisinopril Cough    Patient Care Team: Rubye Beach as PCP - General (Family  Medicine)   Medications: Outpatient Medications Prior to Visit  Medication Sig  . amLODipine (NORVASC) 5 MG tablet Take 1 tablet (5 mg total) by mouth daily.  . famotidine (PEPCID) 20 MG tablet Take 20 mg by mouth at bedtime.   . tamsulosin (FLOMAX) 0.4 MG CAPS capsule Take 1 capsule (0.4 mg total) by mouth daily.  Marland Kitchen triamterene-hydrochlorothiazide (MAXZIDE-25) 37.5-25 MG tablet Take 1 tablet by mouth 2 (two) times daily.   No facility-administered medications prior to visit.    Review of Systems  Constitutional: Negative.   HENT: Positive for hearing loss and tinnitus.   Eyes: Negative.   Respiratory: Negative.   Cardiovascular: Negative.   Gastrointestinal: Negative.   Endocrine: Negative.   Genitourinary: Negative.   Musculoskeletal: Negative.   Skin: Negative.   Allergic/Immunologic: Negative.   Neurological: Negative.   Hematological: Negative.   Psychiatric/Behavioral: Negative.     Last CBC Lab Results  Component Value Date   WBC 4.7 12/31/2018   HGB 15.6 12/31/2018   HCT 45.0 12/31/2018   MCV 99 (H) 12/31/2018   MCH 34.4 (H) 12/31/2018   RDW 12.1 12/31/2018   PLT 106 (L) 10/96/0454   Last metabolic panel Lab Results  Component Value Date   GLUCOSE 141 (H) 12/31/2018   NA 137 12/31/2018   K 4.3 12/31/2018   CL 95 (L) 12/31/2018   CO2 25 12/31/2018   BUN 12 12/31/2018   CREATININE 0.95 12/31/2018   GFRNONAA 87 12/31/2018   GFRAA 101 12/31/2018   CALCIUM 9.4 12/31/2018   PROT 7.2 12/31/2018   ALBUMIN 4.6 12/31/2018   LABGLOB 2.6 12/31/2018   AGRATIO 1.8 12/31/2018   BILITOT 0.8 12/31/2018   ALKPHOS 108 12/31/2018   AST 28 12/31/2018   ALT 22 12/31/2018   ANIONGAP 9 08/09/2014      Objective    BP (!) 141/88 (BP Location: Left Arm, Patient Position: Sitting, Cuff Size: Large)   Pulse 98   Temp 98.2 F (36.8 C) (Oral)   Resp 16   Ht 6' (1.829 m)   Wt 218 lb 12.8 oz (99.2 kg)   BMI 29.67 kg/m  BP Readings from Last 3 Encounters:  01/09/20  (!) 141/88  12/27/19 (!) 157/86  12/25/18 (!) 145/81   Wt Readings from Last 3 Encounters:  01/09/20 218 lb 12.8 oz (99.2 kg)  12/27/19 (!) 230 lb (104.3 kg)  12/25/18 228 lb 9.6 oz (103.7 kg)      Physical Exam Vitals reviewed.  Constitutional:      General: He is not in acute distress.    Appearance: Normal appearance. He is well-developed, well-groomed and overweight. He is not ill-appearing.  HENT:     Head: Normocephalic and atraumatic.     Right Ear: Tympanic membrane, ear canal and external ear normal.     Left Ear: Tympanic membrane, ear canal and external ear normal.  Eyes:     General: No scleral icterus.       Right eye: No discharge.        Left eye: No discharge.     Extraocular Movements: Extraocular movements intact.     Conjunctiva/sclera: Conjunctivae normal.     Pupils: Pupils are equal, round, and reactive to light.  Neck:     Thyroid: No thyromegaly.     Vascular: No carotid bruit.     Trachea: No tracheal deviation.  Cardiovascular:     Rate and Rhythm: Normal rate and regular rhythm.     Pulses: Normal pulses.     Heart sounds: Normal heart sounds. No murmur heard.   Pulmonary:     Effort: Pulmonary effort is normal. No respiratory distress.     Breath sounds: Normal breath sounds. No wheezing or rales.  Chest:     Chest wall: No tenderness.  Abdominal:     General: Abdomen is flat. Bowel sounds are normal. There is no distension.     Palpations: Abdomen is soft. There is no mass.     Tenderness: There is no abdominal tenderness. There is no guarding or rebound.  Musculoskeletal:        General: No tenderness. Normal range of motion.     Cervical back: Normal range of motion and neck supple.     Right lower leg: No edema.     Left lower leg: No edema.  Lymphadenopathy:     Cervical: No cervical adenopathy.  Skin:    General: Skin is warm and dry.     Capillary Refill: Capillary refill takes less than 2 seconds.     Findings: No erythema or  rash.  Neurological:     General: No focal deficit present.     Mental Status: He is alert and oriented to person, place, and time. Mental status is at baseline.     Cranial Nerves: No cranial nerve deficit.     Motor: No abnormal muscle tone.     Coordination: Coordination normal.     Deep Tendon Reflexes: Reflexes are normal and symmetric. Reflexes normal.  Psychiatric:        Mood and Affect: Mood normal.        Behavior: Behavior normal. Behavior is cooperative.        Thought Content: Thought content normal.        Judgment: Judgment normal.      Last depression screening scores PHQ 2/9 Scores 01/09/2020 12/25/2018 12/21/2017  PHQ - 2 Score 0 0 0  PHQ- 9 Score - - -   Last fall risk screening Fall Risk  01/09/2020  Falls in the past year? 0  Number falls in past yr: 0  Injury with Fall? 0  Risk for fall due to : No Fall Risks  Follow up Falls evaluation completed   Last Audit-C alcohol use screening Alcohol Use Disorder Test (AUDIT) 01/09/2020  1. How often do you have a drink containing alcohol? 4  2. How many drinks containing alcohol do you have on a typical day when you are drinking? 1  3. How often do you have six or more drinks on one occasion? 1  AUDIT-C Score 6  Alcohol Brief Interventions/Follow-up AUDIT Score <7 follow-up not indicated   A score of 3 or more in women, and 4 or more in men indicates increased risk for alcohol abuse, EXCEPT if all of the points are from question 1   No results found for any visits on 01/09/20.  Assessment & Plan    Routine Health Maintenance and Physical Exam  Exercise Activities and Dietary recommendations Goals    . Exercise 150 minutes per week (moderate activity)    . Reduce alcohol intake to 1 to 2 servings per day       Immunization History  Administered Date(s) Administered  . Influenza Inj Mdck Quad Pf 03/19/2017, 02/17/2018  . Influenza,inj,Quad PF,6+ Mos 02/27/2019  . Td 12/13/2011  . Tdap 06/01/2016     Health Maintenance  Topic Date Due  . COVID-19 Vaccine (1) Never done  . INFLUENZA VACCINE  12/29/2019  . COLONOSCOPY  02/15/2024  . TETANUS/TDAP  06/01/2026  . Hepatitis C Screening  Completed  . HIV Screening  Completed    Discussed health benefits of physical activity, and encouraged him to engage in regular exercise appropriate for his age and condition.  1. Annual physical exam Normal physical exam today. Will check labs as below and f/u pending lab results. If labs are stable and WNL he will not need to have these rechecked for one year at his next annual physical exam. He is to call the office in the meantime if he has any acute issue, questions or concerns. - CBC with Differential/Platelet - Comprehensive metabolic panel  2. Prediabetes Diet controlled. Will check labs as below and f/u pending results. - CBC with Differential/Platelet - Comprehensive metabolic panel - Hemoglobin A1c - Lipid panel  3. Essential hypertension Continue Amlodipine 5mg  and Maxzide 37.5-25mg . Will check labs as below and f/u pending results. - CBC with Differential/Platelet - Comprehensive metabolic panel - Lipid panel  4. Elevated PSA Followed by Dr. Bernardo Heater, Urology.  5. Benign prostatic hyperplasia without lower urinary tract symptoms Improved symptoms on Tamsulosin. Symptom improvement is better than headache is a problem. He wants to continue tamsulosin. Continue routine f/u with Dr. Bernardo Heater.   6. BMI 29.0-29.9,adult Counseled patient on healthy lifestyle modifications including dieting and exercise.  Will check labs as below and f/u pending results. - Lipid panel   No follow-ups on file.     Reynolds Bowl, PA-C, have reviewed all documentation for this visit. The documentation on 01/09/20 for the exam, diagnosis, procedures, and orders are all accurate and complete.   Rubye Beach  [facility] [phone] (phone) [phone]  (fax)  [place]

## 2020-01-09 ENCOUNTER — Encounter: Payer: Self-pay | Admitting: Physician Assistant

## 2020-01-09 ENCOUNTER — Other Ambulatory Visit: Payer: Self-pay

## 2020-01-09 ENCOUNTER — Ambulatory Visit (INDEPENDENT_AMBULATORY_CARE_PROVIDER_SITE_OTHER): Payer: BLUE CROSS/BLUE SHIELD | Admitting: Physician Assistant

## 2020-01-09 VITALS — BP 141/88 | HR 98 | Temp 98.2°F | Resp 16 | Ht 72.0 in | Wt 218.8 lb

## 2020-01-09 DIAGNOSIS — Z0000 Encounter for general adult medical examination without abnormal findings: Secondary | ICD-10-CM | POA: Diagnosis not present

## 2020-01-09 DIAGNOSIS — R972 Elevated prostate specific antigen [PSA]: Secondary | ICD-10-CM

## 2020-01-09 DIAGNOSIS — I10 Essential (primary) hypertension: Secondary | ICD-10-CM | POA: Diagnosis not present

## 2020-01-09 DIAGNOSIS — N400 Enlarged prostate without lower urinary tract symptoms: Secondary | ICD-10-CM

## 2020-01-09 DIAGNOSIS — R7303 Prediabetes: Secondary | ICD-10-CM

## 2020-01-09 DIAGNOSIS — Z6829 Body mass index (BMI) 29.0-29.9, adult: Secondary | ICD-10-CM

## 2020-01-09 NOTE — Patient Instructions (Signed)

## 2020-01-10 ENCOUNTER — Telehealth: Payer: Self-pay

## 2020-01-10 DIAGNOSIS — E7800 Pure hypercholesterolemia, unspecified: Secondary | ICD-10-CM

## 2020-01-10 LAB — CBC WITH DIFFERENTIAL/PLATELET
Basophils Absolute: 0.1 10*3/uL (ref 0.0–0.2)
Basos: 1 %
EOS (ABSOLUTE): 0.2 10*3/uL (ref 0.0–0.4)
Eos: 4 %
Hematocrit: 43.1 % (ref 37.5–51.0)
Hemoglobin: 14.7 g/dL (ref 13.0–17.7)
Immature Grans (Abs): 0 10*3/uL (ref 0.0–0.1)
Immature Granulocytes: 0 %
Lymphocytes Absolute: 0.9 10*3/uL (ref 0.7–3.1)
Lymphs: 19 %
MCH: 33.8 pg — ABNORMAL HIGH (ref 26.6–33.0)
MCHC: 34.1 g/dL (ref 31.5–35.7)
MCV: 99 fL — ABNORMAL HIGH (ref 79–97)
Monocytes Absolute: 0.5 10*3/uL (ref 0.1–0.9)
Monocytes: 9 %
Neutrophils Absolute: 3.4 10*3/uL (ref 1.4–7.0)
Neutrophils: 67 %
Platelets: 94 10*3/uL — CL (ref 150–450)
RBC: 4.35 x10E6/uL (ref 4.14–5.80)
RDW: 11.8 % (ref 11.6–15.4)
WBC: 5.1 10*3/uL (ref 3.4–10.8)

## 2020-01-10 LAB — COMPREHENSIVE METABOLIC PANEL
ALT: 20 IU/L (ref 0–44)
AST: 24 IU/L (ref 0–40)
Albumin/Globulin Ratio: 1.7 (ref 1.2–2.2)
Albumin: 4.6 g/dL (ref 3.8–4.9)
Alkaline Phosphatase: 108 IU/L (ref 48–121)
BUN/Creatinine Ratio: 17 (ref 10–24)
BUN: 16 mg/dL (ref 8–27)
Bilirubin Total: 1 mg/dL (ref 0.0–1.2)
CO2: 20 mmol/L (ref 20–29)
Calcium: 9.3 mg/dL (ref 8.6–10.2)
Chloride: 98 mmol/L (ref 96–106)
Creatinine, Ser: 0.95 mg/dL (ref 0.76–1.27)
GFR calc Af Amer: 100 mL/min/{1.73_m2} (ref >59)
GFR calc non Af Amer: 87 mL/min/{1.73_m2} (ref >59)
Globulin, Total: 2.7 g/dL (ref 1.5–4.5)
Glucose: 143 mg/dL — ABNORMAL HIGH (ref 65–99)
Potassium: 3.6 mmol/L (ref 3.5–5.2)
Sodium: 137 mmol/L (ref 134–144)
Total Protein: 7.3 g/dL (ref 6.0–8.5)

## 2020-01-10 LAB — LIPID PANEL
Chol/HDL Ratio: 5.2 ratio — ABNORMAL HIGH (ref 0.0–5.0)
Cholesterol, Total: 181 mg/dL (ref 100–199)
HDL: 35 mg/dL — ABNORMAL LOW (ref >39)
LDL Chol Calc (NIH): 104 mg/dL — ABNORMAL HIGH (ref 0–99)
Triglycerides: 241 mg/dL — ABNORMAL HIGH (ref 0–149)
VLDL Cholesterol Cal: 42 mg/dL — ABNORMAL HIGH (ref 5–40)

## 2020-01-10 LAB — HEMOGLOBIN A1C
Est. average glucose Bld gHb Est-mCnc: 131 mg/dL
Hgb A1c MFr Bld: 6.2 % — ABNORMAL HIGH (ref 4.8–5.6)

## 2020-01-10 MED ORDER — ATORVASTATIN CALCIUM 20 MG PO TABS: 20.0000 mg | ORAL_TABLET | Freq: Every day | ORAL | 3 refills | Status: DC

## 2020-01-10 NOTE — Telephone Encounter (Signed)
-----   Message from Mar Daring, PA-C sent at 01/10/2020  7:18 AM EDT ----- Blood count appears fairly stable. Platelets had decreased some, but they appear to have clotted a little in the tube making the number appear lower than what it actually is. Can recheck in 2 weeks if desired. Kidney and liver function are normal. Sodium, potassium, and calcium are normal. A1c remains elevated but stable at 6.2. Cholesterol increased slightly compared to last year. Currently you have a 13.9% chance to have a cardiovascular event over the next 10 years. This is moderately elevated. I would recommend to start a cholesterol lowering medication to decrease your risk.

## 2020-01-10 NOTE — Telephone Encounter (Signed)
Atorvastatin sent to [facility]. Yes we can recheck in 3 months.

## 2020-01-10 NOTE — Telephone Encounter (Signed)
Patient advised as directed below. He agreed to starting a lower cholesterol medicine. He asked if we can just recheck all his labs again in 3 months after starting the cholesterol medicine.  [facility] [place]

## 2020-01-20 ENCOUNTER — Other Ambulatory Visit: Payer: Self-pay | Admitting: Urology

## 2020-01-24 ENCOUNTER — Other Ambulatory Visit: Payer: Self-pay | Admitting: Physician Assistant

## 2020-01-24 DIAGNOSIS — I10 Essential (primary) hypertension: Secondary | ICD-10-CM

## 2020-01-31 ENCOUNTER — Other Ambulatory Visit: Payer: Self-pay

## 2020-01-31 DIAGNOSIS — R972 Elevated prostate specific antigen [PSA]: Secondary | ICD-10-CM

## 2020-02-04 ENCOUNTER — Other Ambulatory Visit: Payer: BLUE CROSS/BLUE SHIELD

## 2020-02-04 ENCOUNTER — Other Ambulatory Visit: Payer: Self-pay

## 2020-02-04 ENCOUNTER — Telehealth: Payer: Self-pay | Admitting: Urology

## 2020-02-04 ENCOUNTER — Other Ambulatory Visit: Payer: Self-pay | Admitting: Physician Assistant

## 2020-02-04 ENCOUNTER — Other Ambulatory Visit: Payer: Self-pay | Admitting: *Deleted

## 2020-02-04 DIAGNOSIS — N400 Enlarged prostate without lower urinary tract symptoms: Secondary | ICD-10-CM

## 2020-02-04 DIAGNOSIS — R972 Elevated prostate specific antigen [PSA]: Secondary | ICD-10-CM

## 2020-02-04 DIAGNOSIS — E7800 Pure hypercholesterolemia, unspecified: Secondary | ICD-10-CM

## 2020-02-04 MED ORDER — ATORVASTATIN CALCIUM 20 MG PO TABS: 20.0000 mg | ORAL_TABLET | Freq: Every day | ORAL | 1 refills | Status: DC

## 2020-02-04 MED ORDER — TAMSULOSIN HCL 0.4 MG PO CAPS: 0.4000 mg | ORAL_CAPSULE | Freq: Every day | ORAL | 11 refills | Status: DC

## 2020-02-04 NOTE — Telephone Encounter (Signed)
Pt came in to office for labwork and would like to switch his RX to [facility] [phone].

## 2020-02-04 NOTE — Telephone Encounter (Signed)
Patient requesting his refill of atorvastatin (LIPITOR) 20 MG tablet to be filled with [address] ([facility]) - [place], [place] - [address] Phone:  [phone]  Fax:  [phone]     Please advise.  Thanks, [employer]

## 2020-02-05 LAB — PSA: Prostate Specific Ag, Serum: 14.2 ng/mL — ABNORMAL HIGH (ref 0.0–4.0)

## 2020-02-07 ENCOUNTER — Telehealth: Payer: Self-pay | Admitting: *Deleted

## 2020-02-07 ENCOUNTER — Other Ambulatory Visit: Payer: Self-pay

## 2020-02-07 NOTE — Telephone Encounter (Signed)
-----   Message from Abbie Sons, MD sent at 02/07/2020  1:37 PM EDT ----- PSA has increased to 14.2.  Would recommend either a repeat prostate MRI or prostate biopsy depending on his preference.  Can also schedule a follow-up appointment to discuss further

## 2020-02-10 NOTE — Telephone Encounter (Signed)
Notified patient as instructed. Patient would like to do prostates biopsy .

## 2020-02-12 ENCOUNTER — Other Ambulatory Visit: Payer: Self-pay | Admitting: Urology

## 2020-02-12 DIAGNOSIS — N400 Enlarged prostate without lower urinary tract symptoms: Secondary | ICD-10-CM

## 2020-03-05 NOTE — Progress Notes (Signed)
Prostate Biopsy Procedure   Informed consent was obtained after discussing risks/benefits of the procedure.  A time out was performed to ensure correct patient identity.  Pre-Procedure: - Last PSA Level: 14.2 on 02/04/2020 - Gentamicin given prophylactically - Levaquin 500 mg administered PO -Transrectal Ultrasound performed revealing a 115 gm prostate -No significant hypoechoic or median lobe noted  Procedure: - Prostate block performed using 10 cc 1% lidocaine and biopsies taken from sextant areas, a total of 12 under ultrasound guidance.  Post-Procedure: - Patient tolerated the procedure well - He was counseled to seek immediate medical attention if experiences any severe pain, significant bleeding, or fevers - Return in one week to discuss biopsy results   I, Selena Batten, am acting as a scribe for Dr. Nicki Reaper C. Stoioff,  I have reviewed the above documentation for accuracy and completeness, and I agree with the above.    Abbie Sons, MD

## 2020-03-06 ENCOUNTER — Other Ambulatory Visit: Payer: Self-pay

## 2020-03-06 ENCOUNTER — Encounter: Payer: Self-pay | Admitting: Urology

## 2020-03-06 ENCOUNTER — Ambulatory Visit (INDEPENDENT_AMBULATORY_CARE_PROVIDER_SITE_OTHER): Payer: BLUE CROSS/BLUE SHIELD | Admitting: Urology

## 2020-03-06 ENCOUNTER — Other Ambulatory Visit: Payer: Self-pay | Admitting: Physician Assistant

## 2020-03-06 VITALS — BP 154/85 | HR 101 | Ht 72.0 in | Wt 230.0 lb

## 2020-03-06 DIAGNOSIS — N400 Enlarged prostate without lower urinary tract symptoms: Secondary | ICD-10-CM

## 2020-03-06 DIAGNOSIS — R972 Elevated prostate specific antigen [PSA]: Secondary | ICD-10-CM

## 2020-03-06 DIAGNOSIS — E7800 Pure hypercholesterolemia, unspecified: Secondary | ICD-10-CM

## 2020-03-06 MED ORDER — LEVOFLOXACIN 500 MG PO TABS
500.0000 mg | ORAL_TABLET | Freq: Once | ORAL | Status: AC
  Administered 2020-03-06: 500 mg via ORAL

## 2020-03-06 MED ORDER — GENTAMICIN SULFATE 40 MG/ML IJ SOLN
80.0000 mg | Freq: Once | INTRAMUSCULAR | Status: AC
  Administered 2020-03-06: 80 mg via INTRAMUSCULAR

## 2020-03-06 NOTE — Telephone Encounter (Signed)
Medication Refill - Medication: Atorvastatin  Has the patient contacted their pharmacy? Yes.   (Agent: If no, request that the patient contact the pharmacy for the refill.) (Agent: If yes, when and what did the pharmacy advise?)  Preferred Pharmacy (with phone number or street name): [facility] ([facility] - [place], [place] [phone]  Agent: Please be advised that RX refills may take up to 3 business days. We ask that you follow-up with your pharmacy.

## 2020-03-09 LAB — SURGICAL PATHOLOGY

## 2020-03-12 ENCOUNTER — Telehealth: Payer: Self-pay | Admitting: Urology

## 2020-03-12 NOTE — Telephone Encounter (Signed)
[facility] informed patient of result and asked to return call to schedule the follow up with PSA prior.

## 2020-03-12 NOTE — Telephone Encounter (Signed)
Prostate biopsy showed no evidence of cancer.  Recommend 84-month office visit with PSA

## 2020-03-16 ENCOUNTER — Other Ambulatory Visit: Payer: Self-pay | Admitting: Physician Assistant

## 2020-03-16 DIAGNOSIS — E7800 Pure hypercholesterolemia, unspecified: Secondary | ICD-10-CM

## 2020-03-16 MED ORDER — ATORVASTATIN CALCIUM 20 MG PO TABS: 20.0000 mg | ORAL_TABLET | Freq: Every day | ORAL | 1 refills | Status: DC

## 2020-03-16 NOTE — Telephone Encounter (Signed)
Medication Refill - Medication: Atorvastatin   Has the patient contacted their pharmacy? Yes.   Pts wife states that the pt is completely out of this medication. Please advise.  (Agent: If no, request that the patient contact the pharmacy for the refill.) (Agent: If yes, when and what did the pharmacy advise?)  Preferred Pharmacy (with phone number or street name):  [address] ([address] - [place], [place] - [address]  [address] [place] [address]  Phone: [phone] Fax: [phone]  Hours: Not open 24 hours     Agent: Please be advised that RX refills may take up to 3 business days. We ask that you follow-up with your pharmacy.

## 2020-03-25 ENCOUNTER — Ambulatory Visit (INDEPENDENT_AMBULATORY_CARE_PROVIDER_SITE_OTHER): Payer: BLUE CROSS/BLUE SHIELD

## 2020-03-25 ENCOUNTER — Other Ambulatory Visit: Payer: Self-pay

## 2020-03-25 DIAGNOSIS — Z23 Encounter for immunization: Secondary | ICD-10-CM | POA: Diagnosis not present

## 2020-04-08 ENCOUNTER — Telehealth: Payer: Self-pay

## 2020-04-08 DIAGNOSIS — M10071 Idiopathic gout, right ankle and foot: Secondary | ICD-10-CM

## 2020-04-08 MED ORDER — PREDNISONE 10 MG (21) PO TBPK: ORAL_TABLET | ORAL | 0 refills | Status: DC

## 2020-04-08 NOTE — Telephone Encounter (Signed)
Prednisone sent in for foot pain

## 2020-04-08 NOTE — Telephone Encounter (Signed)
Copied from [place] [phone]. Topic: General - Other >> Apr 08, 2020  9:41 AM Hinda Lenis D wrote: PT requesting if this medication can be refill again / predniSONE (STERAPRED UNI-PAK 21 TAB) 10 MG (21) TBPK tablet [[id number]] / foot pain has comeback / please advise

## 2020-04-14 ENCOUNTER — Other Ambulatory Visit: Payer: Self-pay | Admitting: Physician Assistant

## 2020-04-14 DIAGNOSIS — I10 Essential (primary) hypertension: Secondary | ICD-10-CM

## 2020-04-29 ENCOUNTER — Ambulatory Visit: Payer: BLUE CROSS/BLUE SHIELD

## 2020-05-20 ENCOUNTER — Other Ambulatory Visit: Payer: Self-pay

## 2020-05-20 ENCOUNTER — Ambulatory Visit (INDEPENDENT_AMBULATORY_CARE_PROVIDER_SITE_OTHER): Payer: BLUE CROSS/BLUE SHIELD

## 2020-05-20 DIAGNOSIS — Z23 Encounter for immunization: Secondary | ICD-10-CM

## 2020-07-12 ENCOUNTER — Other Ambulatory Visit: Payer: Self-pay | Admitting: Physician Assistant

## 2020-07-12 DIAGNOSIS — I10 Essential (primary) hypertension: Secondary | ICD-10-CM

## 2020-07-15 DIAGNOSIS — H40039 Anatomical narrow angle, unspecified eye: Secondary | ICD-10-CM | POA: Diagnosis not present

## 2020-08-07 DIAGNOSIS — Z01818 Encounter for other preprocedural examination: Secondary | ICD-10-CM | POA: Diagnosis not present

## 2020-08-07 DIAGNOSIS — H2511 Age-related nuclear cataract, right eye: Secondary | ICD-10-CM | POA: Diagnosis not present

## 2020-08-27 ENCOUNTER — Other Ambulatory Visit: Payer: Self-pay

## 2020-08-27 ENCOUNTER — Other Ambulatory Visit: Payer: BLUE CROSS/BLUE SHIELD

## 2020-08-27 DIAGNOSIS — R972 Elevated prostate specific antigen [PSA]: Secondary | ICD-10-CM

## 2020-08-28 DIAGNOSIS — H2511 Age-related nuclear cataract, right eye: Secondary | ICD-10-CM | POA: Diagnosis not present

## 2020-08-28 HISTORY — PX: CATARACT EXTRACTION: SUR2

## 2020-08-28 LAB — PSA: Prostate Specific Ag, Serum: 11.3 ng/mL — ABNORMAL HIGH (ref 0.0–4.0)

## 2020-09-03 ENCOUNTER — Ambulatory Visit (INDEPENDENT_AMBULATORY_CARE_PROVIDER_SITE_OTHER): Payer: BLUE CROSS/BLUE SHIELD | Admitting: Urology

## 2020-09-03 ENCOUNTER — Other Ambulatory Visit: Payer: Self-pay

## 2020-09-03 ENCOUNTER — Encounter: Payer: Self-pay | Admitting: Urology

## 2020-09-03 VITALS — BP 133/71 | HR 98 | Ht 72.0 in | Wt 224.0 lb

## 2020-09-03 DIAGNOSIS — N401 Enlarged prostate with lower urinary tract symptoms: Secondary | ICD-10-CM

## 2020-09-03 DIAGNOSIS — R972 Elevated prostate specific antigen [PSA]: Secondary | ICD-10-CM | POA: Diagnosis not present

## 2020-09-03 MED ORDER — TAMSULOSIN HCL 0.4 MG PO CAPS: 0.4000 mg | ORAL_CAPSULE | Freq: Every day | ORAL | 3 refills | Status: DC

## 2020-09-03 NOTE — Progress Notes (Signed)
09/03/2020 9:17 AM   Chad Matthews [birth year] [id number]  Referring provider: Mar Daring, PA-C [place],  [place] [address]  Chief Complaint  Patient presents with  . Elevated PSA    84mo follow up    Urologic history: 1.Elevated PSA -Prostate biopsy 04/2017; PSA 7.7; prostate volume 79 g; benign pathology - PSAJune 2019 9.7; prostate MRI no suspicious lesions; volume 105 g  -PSA bump 14.2 01/2020; repeat biopsy benign; 115 g prostate  HPI: 60 y.o. male presents for follow-up visit.   Prostate biopsy 03/06/2020 for PSA bump 14.2  115 g prostate with benign pathology  83-month follow-up was recommended  PSA 08/27/2020 was 11.3  States voiding pattern has significantly improved on tamsulosin  Denies dysuria, gross hematuria  Denies flank, abdominal or pelvic pain   PMH: Past Medical History:  Diagnosis Date  . GERD (gastroesophageal reflux disease)   . Hernia, hiatal   . Hypertension     Surgical History: Past Surgical History:  Procedure Laterality Date  . bicep surgery Right   . CATARACT EXTRACTION  08/2020  . COLONOSCOPY WITH PROPOFOL N/A 08/27/2015   Procedure: COLONOSCOPY WITH PROPOFOL;  Surgeon: Manya Silvas, MD;  Location: [facility] ENDOSCOPY;  Service: Endoscopy;  Laterality: N/A;  . ELBOW SURGERY Left 1979  . HERNIA REPAIR  1969  . TOTAL HIP ARTHROPLASTY Right 08/08/2014   Procedure: RIGHT TOTAL HIP ARTHROPLASTY;  Surgeon: Earlie Server, MD;  Location: [facility];  Service: Orthopedics;  Laterality: Right;    Home Medications:  Allergies as of 09/03/2020      Reactions   Bee Venom Anaphylaxis   Lisinopril Cough      Medication List       Accurate as of September 03, 2020  9:17 AM. If you have any questions, ask your nurse or doctor.        STOP taking these medications   predniSONE 10 MG (21) Tbpk tablet Commonly known as: STERAPRED UNI-PAK 21 TAB Stopped by: Abbie Sons, MD      TAKE these medications   amLODipine 5 MG tablet Commonly known as: NORVASC TAKE 1 TABLET (5 MG TOTAL) BY MOUTH DAILY.   atorvastatin 20 MG tablet Commonly known as: LIPITOR Take 1 tablet (20 mg total) by mouth daily.   famotidine 20 MG tablet Commonly known as: PEPCID Take 20 mg by mouth at bedtime.   tamsulosin 0.4 MG Caps capsule Commonly known as: FLOMAX TAKE 1 CAPSULE BY MOUTH EVERY DAY   triamterene-hydrochlorothiazide 37.5-25 MG tablet Commonly known as: MAXZIDE-25 TAKE 1 TABLET BY MOUTH 2 (TWO) TIMES DAILY.       Allergies:  Allergies  Allergen Reactions  . Bee Venom Anaphylaxis  . Lisinopril Cough    Family History: Family History  Problem Relation Age of Onset  . Healthy Mother   . Cancer Father   . Stroke Father   . Transient ischemic attack Father   . Lung cancer Father   . Healthy Brother   . Healthy Brother   . Bladder Cancer Neg Hx   . Kidney cancer Neg Hx   . Prostate cancer Neg Hx     Social History:  reports that he quit smoking about 36 years ago. His smoking use included cigarettes. He has a 5.00 pack-year smoking history. He has never used smokeless tobacco. He reports current alcohol use of about 14.0 standard drinks of alcohol per week. He reports that he does not use drugs.   Physical  Exam: BP 133/71   Pulse 98   Ht 6' (1.829 m)   Wt 224 lb (101.6 kg)   BMI 30.38 kg/m   Constitutional:  Alert and oriented, No acute distress. HEENT: [place] AT, moist mucus membranes.  Trachea midline, no masses. Cardiovascular: No clubbing, cyanosis, or edema. Respiratory: Normal respiratory effort, no increased work of breathing. GU: Declined DRE today Skin: No rashes, bruises or suspicious lesions. Neurologic: Grossly intact, no focal deficits, moving all 4 extremities. Psychiatric: Normal mood and affect.   Assessment & Plan:    1.  Elevated PSA  PSA stable 11.3; recent biopsy PSA 14.2  Well move to annual PSA/DRE  2.  BPH with  LUTS  Significant improvement on tamsulosin which was refilled  Annual follow-up as above    Abbie Sons, MD  [facility] [address] [place], [place] [address] [phone]

## 2020-09-07 DIAGNOSIS — H2512 Age-related nuclear cataract, left eye: Secondary | ICD-10-CM | POA: Diagnosis not present

## 2020-09-15 ENCOUNTER — Other Ambulatory Visit: Payer: Self-pay | Admitting: Physician Assistant

## 2020-09-15 DIAGNOSIS — E7800 Pure hypercholesterolemia, unspecified: Secondary | ICD-10-CM

## 2020-12-05 DIAGNOSIS — S93401A Sprain of unspecified ligament of right ankle, initial encounter: Secondary | ICD-10-CM | POA: Diagnosis not present

## 2020-12-18 ENCOUNTER — Ambulatory Visit: Payer: BLUE CROSS/BLUE SHIELD | Admitting: Nurse Practitioner

## 2020-12-23 ENCOUNTER — Other Ambulatory Visit: Payer: Self-pay

## 2020-12-23 ENCOUNTER — Ambulatory Visit: Payer: BLUE CROSS/BLUE SHIELD | Admitting: Nurse Practitioner

## 2020-12-23 ENCOUNTER — Encounter: Payer: Self-pay | Admitting: Nurse Practitioner

## 2020-12-23 VITALS — BP 138/82 | HR 97 | Temp 98.6°F | Ht 72.17 in | Wt 222.1 lb

## 2020-12-23 DIAGNOSIS — I10 Essential (primary) hypertension: Secondary | ICD-10-CM

## 2020-12-23 DIAGNOSIS — Z0000 Encounter for general adult medical examination without abnormal findings: Secondary | ICD-10-CM

## 2020-12-23 DIAGNOSIS — N400 Enlarged prostate without lower urinary tract symptoms: Secondary | ICD-10-CM | POA: Diagnosis not present

## 2020-12-23 DIAGNOSIS — E7800 Pure hypercholesterolemia, unspecified: Secondary | ICD-10-CM | POA: Diagnosis not present

## 2020-12-23 DIAGNOSIS — R7303 Prediabetes: Secondary | ICD-10-CM | POA: Diagnosis not present

## 2020-12-23 DIAGNOSIS — Z862 Personal history of diseases of the blood and blood-forming organs and certain disorders involving the immune mechanism: Secondary | ICD-10-CM

## 2020-12-23 DIAGNOSIS — E876 Hypokalemia: Secondary | ICD-10-CM

## 2020-12-23 LAB — URINALYSIS, ROUTINE W REFLEX MICROSCOPIC
Bilirubin, UA: NEGATIVE
Glucose, UA: NEGATIVE
Ketones, UA: NEGATIVE
Leukocytes,UA: NEGATIVE
Nitrite, UA: NEGATIVE
Protein,UA: NEGATIVE
RBC, UA: NEGATIVE
Specific Gravity, UA: 1.02 (ref 1.005–1.030)
Urobilinogen, Ur: 0.2 mg/dL (ref 0.2–1.0)
pH, UA: 6.5 (ref 5.0–7.5)

## 2020-12-23 MED ORDER — AMLODIPINE BESYLATE 5 MG PO TABS: 5.0000 mg | ORAL_TABLET | Freq: Every day | ORAL | 1 refills | Status: DC

## 2020-12-23 NOTE — Assessment & Plan Note (Signed)
Chronic.  Controlled.  Continue with current medication regimen.  Labs ordered today.  Return to clinic in 6 months for reevaluation.  Call sooner if concerns arise.  ? ?

## 2020-12-23 NOTE — Progress Notes (Deleted)
   There were no vitals taken for this visit.   Subjective:    Patient ID: Chad Matthews, male    DOB: [id number], 60 y.o.   MRN: [id number]  HPI: Chad Matthews is a 60 y.o. male  No chief complaint on file.  Patient presents to clinic to establish care with new PCP.  Patient reports a history of ***. Patient denies a history of: Hypertension, Elevated Cholesterol, Diabetes, Thyroid problems, Depression, Anxiety, Neurological problems, and Abdominal problems.   HYPERTENSION / HYPERLIPIDEMIA Satisfied with current treatment? {Blank single:19197::"yes","no"} Duration of hypertension: {Blank single:19197::"chronic","months","years"} BP monitoring frequency: {Blank single:19197::"not checking","rarely","daily","weekly","monthly","a few times a day","a few times a week","a few times a month"} BP range:  BP medication side effects: {Blank single:19197::"yes","no"} Past BP meds: {Blank A999333 (bystolic)","carvedilol","chlorthalidone","clonidine","diltiazem","exforge HCT","HCTZ","irbesartan (avapro)","labetalol","lisinopril","lisinopril-HCTZ","losartan (cozaar)","methyldopa","nifedipine","olmesartan (benicar)","olmesartan-HCTZ","quinapril","ramipril","spironalactone","tekturna","valsartan","valsartan-HCTZ","verapamil"} Duration of hyperlipidemia: {Blank single:19197::"chronic","months","years"} Cholesterol medication side effects: {Blank single:19197::"yes","no"} Cholesterol supplements: {Blank multiple:19196::"none","fish oil","niacin","red yeast rice"} Past cholesterol medications: {Blank multiple:19196::"none","atorvastain (lipitor)","lovastatin (mevacor)","pravastatin (pravachol)","rosuvastatin (crestor)","simvastatin (zocor)","vytorin","fenofibrate (tricor)","gemfibrozil","ezetimide (zetia)","niaspan","lovaza"} Medication compliance: {Blank single:19197::"excellent  compliance","good compliance","fair compliance","poor compliance"} Aspirin: {Blank single:19197::"yes","no"} Recent stressors: {Blank single:19197::"yes","no"} Recurrent headaches: {Blank single:19197::"yes","no"} Visual changes: {Blank single:19197::"yes","no"} Palpitations: {Blank single:19197::"yes","no"} Dyspnea: {Blank single:19197::"yes","no"} Chest pain: {Blank single:19197::"yes","no"} Lower extremity edema: {Blank single:19197::"yes","no"} Dizzy/lightheaded: {Blank single:19197::"yes","no"}  Relevant past medical, surgical, family and social history reviewed and updated as indicated. Interim medical history since our last visit reviewed. Allergies and medications reviewed and updated.  Review of Systems  Per HPI unless specifically indicated above     Objective:    There were no vitals taken for this visit.  Wt Readings from Last 3 Encounters:  09/03/20 224 lb (101.6 kg)  03/06/20 230 lb (104.3 kg)  01/09/20 218 lb 12.8 oz (99.2 kg)    Physical Exam  Results for orders placed or performed in visit on 08/27/20  PSA  Result Value Ref Range   Prostate Specific Ag, Serum 11.3 (H) 0.0 - 4.0 ng/mL      Assessment & Plan:   Problem List Items Addressed This Visit       Cardiovascular and Mediastinum   Essential hypertension - Primary     Genitourinary   Benign prostatic hyperplasia without lower urinary tract symptoms     Follow up plan: No follow-ups on file.

## 2020-12-23 NOTE — Assessment & Plan Note (Signed)
Controlled.  Continue to follow up with Urology. Call with concerns.

## 2020-12-23 NOTE — Assessment & Plan Note (Signed)
Chronic. Controlled. Labs ordered today. Will make recommendations based on lab results.   

## 2020-12-23 NOTE — Progress Notes (Signed)
BP 138/82   Pulse 97   Temp 98.6 F (37 C)   Ht 6' 0.17" (1.833 m)   Wt 222 lb 2 oz (100.8 kg)   SpO2 98%   BMI 29.99 kg/m    Subjective:    Patient ID: Chad Matthews, male    DOB: [id number], 60 y.o.   MRN: [id number]  HPI: Chad Matthews is a 60 y.o. male presenting on 12/23/2020 for comprehensive medical examination. Current medical complaints include:none  He currently lives with: Wife Interim Problems from his last visit: no  HYPERTENSION / HYPERLIPIDEMIA Satisfied with current treatment? yes Duration of hypertension: years BP monitoring frequency: not checking BP range:  BP medication side effects: no Past BP meds:  Maxide and amlodipine Duration of hyperlipidemia: years Cholesterol medication side effects: no Cholesterol supplements: none Past cholesterol medications: atorvastain (lipitor) Medication compliance: excellent compliance Aspirin: no Recent stressors: no Recurrent headaches: no Visual changes: no Palpitations: no Dyspnea: no Chest pain: no Lower extremity edema: no Dizzy/lightheaded: no  Depression Screen done today and results listed below:  Depression screen [facility] 2/9 12/23/2020 01/09/2020 12/25/2018 12/21/2017 12/19/2016  Decreased Interest 0 0 0 0 0  Down, Depressed, Hopeless 0 0 0 0 0  PHQ - 2 Score 0 0 0 0 0  Altered sleeping - - - - 0  Tired, decreased energy - - - - 0  Change in appetite - - - - 0  Feeling bad or failure about yourself  - - - - 0  Trouble concentrating - - - - 0  Moving slowly or fidgety/restless - - - - 0  Suicidal thoughts - - - - 0  PHQ-9 Score - - - - 0    The patient does not have a history of falls. I did complete a risk assessment for falls. A plan of care for falls was documented.   Past Medical History:  Past Medical History:  Diagnosis Date   Arthritis    Cataract    GERD (gastroesophageal reflux disease)    Hernia, hiatal    Hypertension     Surgical History:  Past Surgical History:   Procedure Laterality Date   bicep surgery Right    CATARACT EXTRACTION  08/2020   COLONOSCOPY WITH PROPOFOL N/A 08/27/2015   Procedure: COLONOSCOPY WITH PROPOFOL;  Surgeon: Manya Silvas, MD;  Location: [place] [facility];  Service: Endoscopy;  Laterality: N/A;   ELBOW SURGERY Left [facility]   TOTAL HIP ARTHROPLASTY Right 08/08/2014   Procedure: RIGHT TOTAL HIP ARTHROPLASTY;  Surgeon: Earlie Server, MD;  Location: [facility];  Service: Orthopedics;  Laterality: Right;    Medications:  Current Outpatient Medications on File Prior to Visit  Medication Sig   Multiple Vitamins-Minerals (CENTRUM SILVER 50+MEN PO) Take by mouth daily.   atorvastatin (LIPITOR) 20 MG tablet TAKE 1 TABLET BY MOUTH ONCE DAILY   famotidine (PEPCID) 20 MG tablet Take 20 mg by mouth at bedtime.    tamsulosin (FLOMAX) 0.4 MG CAPS capsule Take 1 capsule (0.4 mg total) by mouth daily.   triamterene-hydrochlorothiazide (MAXZIDE-25) 37.5-25 MG tablet TAKE 1 TABLET BY MOUTH 2 (TWO) TIMES DAILY.   No current facility-administered medications on file prior to visit.    Allergies:  Allergies  Allergen Reactions   Bee Venom Anaphylaxis   Lisinopril Cough    Social History:  Social History   Socioeconomic History   Marital status: Married    Spouse name: Hilda Blades   Number of  children: Not on file   Years of education: College   Highest education level: Not on file  Occupational History   Occupation: Armed forces technical officer    Comment: Full-Time  Tobacco Use   Smoking status: Former    Packs/day: 0.50    Years: 10.00    Pack years: 5.00    Types: Cigarettes    Quit date: 07/25/1984    Years since quitting: 36.4   Smokeless tobacco: Never  Vaping Use   Vaping Use: Never used  Substance and Sexual Activity   Alcohol use: Yes    Alcohol/week: 14.0 standard drinks    Types: 14 Cans of beer per week    Comment: daily   Drug use: No   Sexual activity: Yes    Birth control/protection: None  Other  Topics Concern   Not on file  Social History Narrative   Not on file   Social Determinants of Health   Financial Resource Strain: Not on file  Food Insecurity: Not on file  Transportation Needs: Not on file  Physical Activity: Not on file  Stress: Not on file  Social Connections: Not on file  Intimate Partner Violence: Not on file   Social History   Tobacco Use  Smoking Status Former   Packs/day: 0.50   Years: 10.00   Pack years: 5.00   Types: Cigarettes   Quit date: 07/25/1984   Years since quitting: 36.4  Smokeless Tobacco Never   Social History   Substance and Sexual Activity  Alcohol Use Yes   Alcohol/week: 14.0 standard drinks   Types: 14 Cans of beer per week   Comment: daily    Family History:  Family History  Problem Relation Age of Onset   Healthy Mother    Cancer Father    Stroke Father    Transient ischemic attack Father    Lung cancer Father    Healthy Brother    Healthy Brother    Alzheimer's disease Maternal Grandmother    Osteoporosis Paternal Grandmother    Bladder Cancer Neg Hx    Kidney cancer Neg Hx    Prostate cancer Neg Hx     Past medical history, surgical history, medications, allergies, family history and social history reviewed with patient today and changes made to appropriate areas of the chart.   Review of Systems  Eyes:  Negative for blurred vision and double vision.  Respiratory:  Negative for shortness of breath.   Cardiovascular:  Negative for chest pain, palpitations and leg swelling.  Neurological:  Negative for dizziness and headaches.  All other ROS negative except what is listed above and in the HPI.      Objective:    BP 138/82   Pulse 97   Temp 98.6 F (37 C)   Ht 6' 0.17" (1.833 m)   Wt 222 lb 2 oz (100.8 kg)   SpO2 98%   BMI 29.99 kg/m   Wt Readings from Last 3 Encounters:  12/23/20 222 lb 2 oz (100.8 kg)  09/03/20 224 lb (101.6 kg)  03/06/20 230 lb (104.3 kg)    Physical Exam Vitals and nursing  note reviewed.  Constitutional:      General: He is not in acute distress.    Appearance: Normal appearance. He is not ill-appearing, toxic-appearing or diaphoretic.  HENT:     Head: Normocephalic.     Right Ear: Tympanic membrane, ear canal and external ear normal.     Left Ear: Tympanic membrane, ear canal and external ear  normal.     Nose: Nose normal. No congestion or rhinorrhea.     Mouth/Throat:     Mouth: Mucous membranes are moist.  Eyes:     General:        Right eye: No discharge.        Left eye: No discharge.     Extraocular Movements: Extraocular movements intact.     Conjunctiva/sclera: Conjunctivae normal.     Pupils: Pupils are equal, round, and reactive to light.  Cardiovascular:     Rate and Rhythm: Normal rate and regular rhythm.     Heart sounds: No murmur heard. Pulmonary:     Effort: Pulmonary effort is normal. No respiratory distress.     Breath sounds: Normal breath sounds. No wheezing, rhonchi or rales.  Abdominal:     General: Abdomen is flat. Bowel sounds are normal. There is no distension.     Palpations: Abdomen is soft.     Tenderness: There is no abdominal tenderness. There is no guarding.  Musculoskeletal:     Cervical back: Normal range of motion and neck supple.  Skin:    General: Skin is warm and dry.     Capillary Refill: Capillary refill takes less than 2 seconds.  Neurological:     General: No focal deficit present.     Mental Status: He is alert and oriented to person, place, and time.     Cranial Nerves: No cranial nerve deficit.     Motor: No weakness.     Deep Tendon Reflexes: Reflexes normal.  Psychiatric:        Mood and Affect: Mood normal.        Behavior: Behavior normal.        Thought Content: Thought content normal.        Judgment: Judgment normal.    Results for orders placed or performed in visit on 08/27/20  PSA  Result Value Ref Range   Prostate Specific Ag, Serum 11.3 (H) 0.0 - 4.0 ng/mL      Assessment &  Plan:   Problem List Items Addressed This Visit       Cardiovascular and Mediastinum   Essential hypertension    Chronic.  Controlled.  Continue with current medication regimen.  Labs ordered today.  Return to clinic in 6 months for reevaluation.  Call sooner if concerns arise.         Relevant Medications   amLODipine (NORVASC) 5 MG tablet     Genitourinary   Benign prostatic hyperplasia without lower urinary tract symptoms    Controlled.  Continue to follow up with Urology. Call with concerns.          Other   History of thrombocytopenia    Labs ordered today. Will make recommendations based on lab results.        Hypercholesteremia    Chronic.  Controlled.  Continue with current medication regimen.  Labs ordered today.  Return to clinic in 6 months for reevaluation.  Call sooner if concerns arise.         Relevant Medications   amLODipine (NORVASC) 5 MG tablet   Prediabetes    Chronic. Controlled. Labs ordered today. Will make recommendations based on lab results.        Relevant Orders   HgB A1c   Other Visit Diagnoses     Annual physical exam    -  Primary   Health maintenance reviewed today. Labs ordered today. Will get shingles vaccine.   Relevant  Orders   TSH   PSA   Lipid panel   CBC with Differential/Platelet   Comprehensive metabolic panel   Urinalysis, Routine w reflex microscopic        Discussed aspirin prophylaxis for myocardial infarction prevention and decision was it was not indicated  LABORATORY TESTING:  Health maintenance labs ordered today as discussed above.   The natural history of prostate cancer and ongoing controversy regarding screening and potential treatment outcomes of prostate cancer has been discussed with the patient. The meaning of a false positive PSA and a false negative PSA has been discussed. He indicates understanding of the limitations of this screening test and wishes to proceed with screening PSA  testing.   IMMUNIZATIONS:   - Tdap: Tetanus vaccination status reviewed: last tetanus booster within 10 years. - Influenza: Postponed to flu season - Pneumovax: Not applicable - Prevnar: Not applicable - HPV: Not applicable - Zostavax vaccine:  not up to date  SCREENING: - Colonoscopy: Up to date  Discussed with patient purpose of the colonoscopy is to detect colon cancer at curable precancerous or early stages   - AAA Screening: Not applicable  -Hearing Test: Not applicable  -Spirometry: Not applicable   PATIENT COUNSELING:    Sexuality: Discussed sexually transmitted diseases, partner selection, use of condoms, avoidance of unintended pregnancy  and contraceptive alternatives.   Advised to avoid cigarette smoking.  I discussed with the patient that most people either abstain from alcohol or drink within safe limits (<=14/week and <=4 drinks/occasion for males, <=7/weeks and <= 3 drinks/occasion for females) and that the risk for alcohol disorders and other health effects rises proportionally with the number of drinks per week and how often a drinker exceeds daily limits.  Discussed cessation/primary prevention of drug use and availability of treatment for abuse.   Diet: Encouraged to adjust caloric intake to maintain  or achieve ideal body weight, to reduce intake of dietary saturated fat and total fat, to limit sodium intake by avoiding high sodium foods and not adding table salt, and to maintain adequate dietary potassium and calcium preferably from fresh fruits, vegetables, and low-fat dairy products.    stressed the importance of regular exercise  Injury prevention: Discussed safety belts, safety helmets, smoke detector, smoking near bedding or upholstery.   Dental health: Discussed importance of regular tooth brushing, flossing, and dental visits.   Follow up plan: NEXT PREVENTATIVE PHYSICAL DUE IN 1 YEAR. Return in about 6 months (around 06/25/2021) for HTN, HLD, DM2  FU.

## 2020-12-23 NOTE — Assessment & Plan Note (Signed)
Labs ordered today.  Will make recommendations based on lab results. ?

## 2020-12-24 LAB — COMPREHENSIVE METABOLIC PANEL
ALT: 25 IU/L (ref 0–44)
AST: 28 IU/L (ref 0–40)
Albumin/Globulin Ratio: 2 (ref 1.2–2.2)
Albumin: 5 g/dL — ABNORMAL HIGH (ref 3.8–4.8)
Alkaline Phosphatase: 119 IU/L (ref 44–121)
BUN/Creatinine Ratio: 13 (ref 10–24)
BUN: 12 mg/dL (ref 8–27)
Bilirubin Total: 1.3 mg/dL — ABNORMAL HIGH (ref 0.0–1.2)
CO2: 23 mmol/L (ref 20–29)
Calcium: 9.4 mg/dL (ref 8.6–10.2)
Chloride: 94 mmol/L — ABNORMAL LOW (ref 96–106)
Creatinine, Ser: 0.96 mg/dL (ref 0.76–1.27)
Globulin, Total: 2.5 g/dL (ref 1.5–4.5)
Glucose: 108 mg/dL — ABNORMAL HIGH (ref 65–99)
Potassium: 3.2 mmol/L — ABNORMAL LOW (ref 3.5–5.2)
Sodium: 138 mmol/L (ref 134–144)
Total Protein: 7.5 g/dL (ref 6.0–8.5)
eGFR: 90 mL/min/{1.73_m2} (ref >59)

## 2020-12-24 LAB — CBC WITH DIFFERENTIAL/PLATELET
Basophils Absolute: 0 10*3/uL (ref 0.0–0.2)
Basos: 1 %
EOS (ABSOLUTE): 0.3 10*3/uL (ref 0.0–0.4)
Eos: 4 %
Hematocrit: 43.1 % (ref 37.5–51.0)
Hemoglobin: 14 g/dL (ref 13.0–17.7)
Immature Grans (Abs): 0 10*3/uL (ref 0.0–0.1)
Immature Granulocytes: 0 %
Lymphocytes Absolute: 1.3 10*3/uL (ref 0.7–3.1)
Lymphs: 19 %
MCH: 32 pg (ref 26.6–33.0)
MCHC: 32.5 g/dL (ref 31.5–35.7)
MCV: 98 fL — ABNORMAL HIGH (ref 79–97)
Monocytes Absolute: 0.5 10*3/uL (ref 0.1–0.9)
Monocytes: 7 %
Neutrophils Absolute: 4.6 10*3/uL (ref 1.4–7.0)
Neutrophils: 69 %
Platelets: 106 10*3/uL — ABNORMAL LOW (ref 150–450)
RBC: 4.38 x10E6/uL (ref 4.14–5.80)
RDW: 12.2 % (ref 11.6–15.4)
WBC: 6.7 10*3/uL (ref 3.4–10.8)

## 2020-12-24 LAB — HEMOGLOBIN A1C
Est. average glucose Bld gHb Est-mCnc: 134 mg/dL
Hgb A1c MFr Bld: 6.3 % — ABNORMAL HIGH (ref 4.8–5.6)

## 2020-12-24 LAB — LIPID PANEL
Chol/HDL Ratio: 3.2 ratio (ref 0.0–5.0)
Cholesterol, Total: 133 mg/dL (ref 100–199)
HDL: 41 mg/dL (ref >39)
LDL Chol Calc (NIH): 59 mg/dL (ref 0–99)
Triglycerides: 198 mg/dL — ABNORMAL HIGH (ref 0–149)
VLDL Cholesterol Cal: 33 mg/dL (ref 5–40)

## 2020-12-24 LAB — TSH: TSH: 1.08 u[IU]/mL (ref 0.450–4.500)

## 2020-12-24 LAB — PSA: Prostate Specific Ag, Serum: 13.4 ng/mL — ABNORMAL HIGH (ref 0.0–4.0)

## 2020-12-24 NOTE — Progress Notes (Signed)
Please let patient know that his lab work looks pretty good overall.  His PSA is elevated from prior at 13.4.  I recommend he follow up with Urologist.  Cholesterol is improved from prior.  Keep up the good work, continue with medication regimen.   Patient's potassium is on the lower side. I would like him to come back for a lab visit next week to have it drawn and make sure it returns to normal.  A1c remains well controlled at 6.3.  Keep up the good work.   See you in 6 months.

## 2020-12-24 NOTE — Addendum Note (Signed)
Addended by: Jon Billings on: 12/24/2020 07:51 AM   Modules accepted: Orders

## 2020-12-31 ENCOUNTER — Other Ambulatory Visit: Payer: BLUE CROSS/BLUE SHIELD

## 2020-12-31 ENCOUNTER — Other Ambulatory Visit: Payer: Self-pay

## 2020-12-31 DIAGNOSIS — E876 Hypokalemia: Secondary | ICD-10-CM

## 2020-12-31 DIAGNOSIS — R972 Elevated prostate specific antigen [PSA]: Secondary | ICD-10-CM

## 2021-01-01 ENCOUNTER — Encounter: Payer: Self-pay | Admitting: Family Medicine

## 2021-01-01 LAB — COMPREHENSIVE METABOLIC PANEL
ALT: 22 IU/L (ref 0–44)
AST: 26 IU/L (ref 0–40)
Albumin/Globulin Ratio: 2 (ref 1.2–2.2)
Albumin: 4.5 g/dL (ref 3.8–4.8)
Alkaline Phosphatase: 105 IU/L (ref 44–121)
BUN/Creatinine Ratio: 13 (ref 10–24)
BUN: 11 mg/dL (ref 8–27)
Bilirubin Total: 0.7 mg/dL (ref 0.0–1.2)
CO2: 24 mmol/L (ref 20–29)
Calcium: 9.5 mg/dL (ref 8.6–10.2)
Chloride: 96 mmol/L (ref 96–106)
Creatinine, Ser: 0.88 mg/dL (ref 0.76–1.27)
Globulin, Total: 2.3 g/dL (ref 1.5–4.5)
Glucose: 124 mg/dL — ABNORMAL HIGH (ref 65–99)
Potassium: 3.7 mmol/L (ref 3.5–5.2)
Sodium: 136 mmol/L (ref 134–144)
Total Protein: 6.8 g/dL (ref 6.0–8.5)
eGFR: 98 mL/min/{1.73_m2} (ref >59)

## 2021-01-01 LAB — PSA: Prostate Specific Ag, Serum: 13.5 ng/mL — ABNORMAL HIGH (ref 0.0–4.0)

## 2021-01-01 NOTE — Progress Notes (Signed)
Hi Donnell. Your potassium has returned to normal which is great news.  We will continue to monitor in the future. Please let me know if you have any questions.

## 2021-01-01 NOTE — Progress Notes (Signed)
Hi Chad Matthews. Your potassium improved which is great news.  We will continue to monitor in the future.  Let me know if you have any questions.

## 2021-01-11 ENCOUNTER — Encounter: Payer: Self-pay | Admitting: Family Medicine

## 2021-01-11 ENCOUNTER — Encounter: Payer: Self-pay | Admitting: Physician Assistant

## 2021-02-01 ENCOUNTER — Other Ambulatory Visit: Payer: Self-pay | Admitting: Urology

## 2021-02-01 DIAGNOSIS — N401 Enlarged prostate with lower urinary tract symptoms: Secondary | ICD-10-CM

## 2021-02-24 ENCOUNTER — Other Ambulatory Visit: Payer: Self-pay | Admitting: Urology

## 2021-02-24 DIAGNOSIS — N401 Enlarged prostate with lower urinary tract symptoms: Secondary | ICD-10-CM

## 2021-04-18 ENCOUNTER — Other Ambulatory Visit: Payer: Self-pay | Admitting: Physician Assistant

## 2021-04-18 DIAGNOSIS — I10 Essential (primary) hypertension: Secondary | ICD-10-CM

## 2021-04-26 ENCOUNTER — Telehealth: Payer: Self-pay

## 2021-04-26 ENCOUNTER — Other Ambulatory Visit: Payer: Self-pay

## 2021-04-26 DIAGNOSIS — I10 Essential (primary) hypertension: Secondary | ICD-10-CM

## 2021-04-26 MED ORDER — TRIAMTERENE-HCTZ 37.5-25 MG PO TABS: 1.0000 | ORAL_TABLET | Freq: Every day | ORAL | 1 refills | Status: DC

## 2021-04-26 NOTE — Telephone Encounter (Signed)
Please verify how many times per day patient is taking medication.  If he is taking it twice we need to drop it to once and reevaluate his blood pressure in a month.

## 2021-04-26 NOTE — Telephone Encounter (Signed)
Medication sent to the pharmacy.

## 2021-04-26 NOTE — Telephone Encounter (Signed)
Patient last seen 12/23/20 and has appointment 06/25/21

## 2021-05-11 ENCOUNTER — Encounter: Payer: Self-pay | Admitting: Urology

## 2021-06-23 ENCOUNTER — Telehealth: Payer: Self-pay | Admitting: Nurse Practitioner

## 2021-06-23 NOTE — Telephone Encounter (Signed)
Copied from [place] [id number]. Topic: General - Other >> Jun 23, 2021  3:05 PM Erick Blinks wrote: Reason for CRM: Pt wants to know if he needs to fast for his lab work this Friday, please advise.   Best contact: [phone]

## 2021-06-24 NOTE — Progress Notes (Signed)
BP 138/86    Pulse 79    Temp 99.2 F (37.3 C) (Oral)    Ht 6' 0.17" (1.833 m)    Wt 227 lb 12.8 oz (103.3 kg)    SpO2 98%    BMI 30.75 kg/m    Subjective:    Patient ID: Chad Matthews, male    DOB: [birth year], 61 y.o.   MRN: [id number]  HPI: Chad Matthews is a 61 y.o. male  Chief Complaint  Patient presents with   Hypertension   Hyperlipidemia   Diabetes   HYPERTENSION / [place] Satisfied with current treatment? yes Duration of hypertension: years BP monitoring frequency: not checking BP range:  BP medication side effects: no Past BP meds:  Triamterene, amlodipine, and HCTZ Duration of hyperlipidemia: years Cholesterol medication side effects: no Cholesterol supplements: none Past cholesterol medications: atorvastain (lipitor) Medication compliance: excellent compliance Aspirin: no Recent stressors: no Recurrent headaches: no Visual changes: no Palpitations: no Dyspnea: no Chest pain: no Lower extremity edema: no Dizzy/lightheaded: no  Relevant past medical, surgical, family and social history reviewed and updated as indicated. Interim medical history since our last visit reviewed. Allergies and medications reviewed and updated.  Review of Systems  Eyes:  Negative for visual disturbance.  Respiratory:  Negative for chest tightness and shortness of breath.   Cardiovascular:  Negative for chest pain, palpitations and leg swelling.  Neurological:  Negative for dizziness, light-headedness and headaches.   Per HPI unless specifically indicated above     Objective:    BP 138/86    Pulse 79    Temp 99.2 F (37.3 C) (Oral)    Ht 6' 0.17" (1.833 m)    Wt 227 lb 12.8 oz (103.3 kg)    SpO2 98%    BMI 30.75 kg/m   Wt Readings from Last 3 Encounters:  06/25/21 227 lb 12.8 oz (103.3 kg)  12/23/20 222 lb 2 oz (100.8 kg)  09/03/20 224 lb (101.6 kg)    Physical Exam Vitals and nursing note reviewed.  Constitutional:      General: He is not in acute  distress.    Appearance: Normal appearance. He is not ill-appearing, toxic-appearing or diaphoretic.  HENT:     Head: Normocephalic.     Right Ear: External ear normal.     Left Ear: External ear normal.     Nose: Nose normal. No congestion or rhinorrhea.     Mouth/Throat:     Mouth: Mucous membranes are moist.  Eyes:     General:        Right eye: No discharge.        Left eye: No discharge.     Extraocular Movements: Extraocular movements intact.     Conjunctiva/sclera: Conjunctivae normal.     Pupils: Pupils are equal, round, and reactive to light.  Cardiovascular:     Rate and Rhythm: Normal rate and regular rhythm.     Heart sounds: No murmur heard. Pulmonary:     Effort: Pulmonary effort is normal. No respiratory distress.     Breath sounds: Normal breath sounds. No wheezing, rhonchi or rales.  Abdominal:     General: Abdomen is flat. Bowel sounds are normal.  Musculoskeletal:     Cervical back: Normal range of motion and neck supple.  Skin:    General: Skin is warm and dry.     Capillary Refill: Capillary refill takes less than 2 seconds.  Neurological:     General: No focal deficit present.  Mental Status: He is alert and oriented to person, place, and time.  Psychiatric:        Mood and Affect: Mood normal.        Behavior: Behavior normal.        Thought Content: Thought content normal.        Judgment: Judgment normal.    Results for orders placed or performed in visit on 12/31/20  Comp Met (CMET)  Result Value Ref Range   Glucose 124 (H) 65 - 99 mg/dL   BUN 11 8 - 27 mg/dL   Creatinine, Ser 0.88 0.76 - 1.27 mg/dL   eGFR 98 >59 mL/min/1.73   BUN/Creatinine Ratio 13 10 - 24   Sodium 136 134 - 144 mmol/L   Potassium 3.7 3.5 - 5.2 mmol/L   Chloride 96 96 - 106 mmol/L   CO2 24 20 - 29 mmol/L   Calcium 9.5 8.6 - 10.2 mg/dL   Total Protein 6.8 6.0 - 8.5 g/dL   Albumin 4.5 3.8 - 4.8 g/dL   Globulin, Total 2.3 1.5 - 4.5 g/dL   Albumin/Globulin Ratio 2.0  1.2 - 2.2   Bilirubin Total 0.7 0.0 - 1.2 mg/dL   Alkaline Phosphatase 105 44 - 121 IU/L   AST 26 0 - 40 IU/L   ALT 22 0 - 44 IU/L  PSA  Result Value Ref Range   Prostate Specific Ag, Serum 13.5 (H) 0.0 - 4.0 ng/mL      Assessment & Plan:   Problem List Items Addressed This Visit       Cardiovascular and Mediastinum   Essential hypertension - Primary   Relevant Medications   atorvastatin (LIPITOR) 20 MG tablet   amLODipine (NORVASC) 5 MG tablet   triamterene-hydrochlorothiazide (MAXZIDE-25) 37.5-25 MG tablet   Other Relevant Orders   Comp Met (CMET)     Other   Hypercholesteremia    Chronic.  Controlled.  Continue with current medication regimen.  Refills sent today.  Labs ordered today.  Return to clinic in 6 months for reevaluation.  Call sooner if concerns arise.        Relevant Medications   atorvastatin (LIPITOR) 20 MG tablet   amLODipine (NORVASC) 5 MG tablet   triamterene-hydrochlorothiazide (MAXZIDE-25) 37.5-25 MG tablet   Other Relevant Orders   Lipid Profile   Prediabetes    Labs ordered today. Will make recommendations based on lab results.      Relevant Orders   HgB A1c   Other Visit Diagnoses     Need for shingles vaccine       Relevant Orders   Varicella-zoster vaccine IM (Shingrix)   Hypercholesterolemia       Relevant Medications   atorvastatin (LIPITOR) 20 MG tablet   amLODipine (NORVASC) 5 MG tablet   triamterene-hydrochlorothiazide (MAXZIDE-25) 37.5-25 MG tablet        Follow up plan: Return in about 6 months (around 12/23/2021) for Physical and Fasting labs and shingles.

## 2021-06-25 ENCOUNTER — Ambulatory Visit: Payer: BLUE CROSS/BLUE SHIELD | Admitting: Nurse Practitioner

## 2021-06-25 ENCOUNTER — Other Ambulatory Visit: Payer: Self-pay

## 2021-06-25 ENCOUNTER — Encounter: Payer: Self-pay | Admitting: Nurse Practitioner

## 2021-06-25 VITALS — BP 138/86 | HR 79 | Temp 99.2°F | Ht 72.17 in | Wt 227.8 lb

## 2021-06-25 DIAGNOSIS — Z23 Encounter for immunization: Secondary | ICD-10-CM | POA: Diagnosis not present

## 2021-06-25 DIAGNOSIS — I10 Essential (primary) hypertension: Secondary | ICD-10-CM

## 2021-06-25 DIAGNOSIS — E7800 Pure hypercholesterolemia, unspecified: Secondary | ICD-10-CM | POA: Diagnosis not present

## 2021-06-25 DIAGNOSIS — R7303 Prediabetes: Secondary | ICD-10-CM

## 2021-06-25 MED ORDER — TRIAMTERENE-HCTZ 37.5-25 MG PO TABS: 1.0000 | ORAL_TABLET | Freq: Every day | ORAL | 1 refills | Status: DC

## 2021-06-25 MED ORDER — AMLODIPINE BESYLATE 5 MG PO TABS: 5.0000 mg | ORAL_TABLET | Freq: Every day | ORAL | 1 refills | Status: DC

## 2021-06-25 MED ORDER — ATORVASTATIN CALCIUM 20 MG PO TABS: 20.0000 mg | ORAL_TABLET | Freq: Every day | ORAL | 1 refills | Status: DC

## 2021-06-25 NOTE — Assessment & Plan Note (Signed)
Labs ordered today.  Will make recommendations based on lab results. ?

## 2021-06-25 NOTE — Assessment & Plan Note (Signed)
Chronic.  Controlled.  Continue with current medication regimen.  Refills sent today.  Labs ordered today.  Return to clinic in 6 months for reevaluation.  Call sooner if concerns arise.

## 2021-06-26 LAB — HEMOGLOBIN A1C
Est. average glucose Bld gHb Est-mCnc: 134 mg/dL
Hgb A1c MFr Bld: 6.3 % — ABNORMAL HIGH (ref 4.8–5.6)

## 2021-06-26 LAB — LIPID PANEL
Chol/HDL Ratio: 3.8 ratio (ref 0.0–5.0)
Cholesterol, Total: 140 mg/dL (ref 100–199)
HDL: 37 mg/dL — ABNORMAL LOW (ref >39)
LDL Chol Calc (NIH): 64 mg/dL (ref 0–99)
Triglycerides: 241 mg/dL — ABNORMAL HIGH (ref 0–149)
VLDL Cholesterol Cal: 39 mg/dL (ref 5–40)

## 2021-06-26 LAB — COMPREHENSIVE METABOLIC PANEL
ALT: 26 IU/L (ref 0–44)
AST: 30 IU/L (ref 0–40)
Albumin/Globulin Ratio: 2.1 (ref 1.2–2.2)
Albumin: 4.8 g/dL (ref 3.8–4.8)
Alkaline Phosphatase: 113 IU/L (ref 44–121)
BUN/Creatinine Ratio: 16 (ref 10–24)
BUN: 13 mg/dL (ref 8–27)
Bilirubin Total: 0.9 mg/dL (ref 0.0–1.2)
CO2: 26 mmol/L (ref 20–29)
Calcium: 9.7 mg/dL (ref 8.6–10.2)
Chloride: 99 mmol/L (ref 96–106)
Creatinine, Ser: 0.82 mg/dL (ref 0.76–1.27)
Globulin, Total: 2.3 g/dL (ref 1.5–4.5)
Glucose: 124 mg/dL — ABNORMAL HIGH (ref 70–99)
Potassium: 3.9 mmol/L (ref 3.5–5.2)
Sodium: 139 mmol/L (ref 134–144)
Total Protein: 7.1 g/dL (ref 6.0–8.5)
eGFR: 99 mL/min/{1.73_m2} (ref >59)

## 2021-06-28 NOTE — Progress Notes (Signed)
Hi Chad Matthews. It was nice to see you last week.  Your blood work shows that your cholesterol is elevated from prior. I recommend decreasing processed food and refined sugar intake. Your A1c remains well controlled at 6.3.  Otherwise, your lab work looks good. Follow up as discussed.

## 2021-09-01 ENCOUNTER — Other Ambulatory Visit: Payer: Self-pay | Admitting: *Deleted

## 2021-09-01 DIAGNOSIS — R972 Elevated prostate specific antigen [PSA]: Secondary | ICD-10-CM

## 2021-09-02 ENCOUNTER — Other Ambulatory Visit: Payer: BLUE CROSS/BLUE SHIELD

## 2021-09-02 DIAGNOSIS — R972 Elevated prostate specific antigen [PSA]: Secondary | ICD-10-CM

## 2021-09-03 LAB — PSA: Prostate Specific Ag, Serum: 14.4 ng/mL — ABNORMAL HIGH (ref 0.0–4.0)

## 2021-09-06 ENCOUNTER — Ambulatory Visit: Payer: Self-pay | Admitting: Urology

## 2021-09-08 ENCOUNTER — Encounter: Payer: Self-pay | Admitting: Urology

## 2021-09-08 ENCOUNTER — Ambulatory Visit: Payer: BLUE CROSS/BLUE SHIELD | Admitting: Urology

## 2021-09-08 VITALS — BP 154/79 | HR 82 | Ht 72.0 in | Wt 225.0 lb

## 2021-09-08 DIAGNOSIS — R972 Elevated prostate specific antigen [PSA]: Secondary | ICD-10-CM | POA: Diagnosis not present

## 2021-09-08 DIAGNOSIS — N401 Enlarged prostate with lower urinary tract symptoms: Secondary | ICD-10-CM | POA: Diagnosis not present

## 2021-09-08 NOTE — Progress Notes (Signed)
? ?09/08/2021 ?9:08 AM  ? ?ARLYN BUERKLE ?[birth year] ?[id number] ? ?Referring provider: Mar Daring, PA-C ?[address] ?[address] ?[place],  [place] [address] ? ?Chief Complaint  ?Patient presents with  ? Elevated PSA  ? ? ?Urologic history: ?1.  Elevated PSA ?Prostate biopsy 04/2017; PSA 7.7; prostate volume 79 g; benign pathology ?PSA June 2019 9.7; prostate MRI no suspicious lesions; volume 105 g ?PSA bump 14.2 01/2020; repeat biopsy 02/2020 benign; 115 g prostate ? ?HPI: ?61 y.o. male presents for annual follow-up. ? ?Doing well since last visit ?No bothersome LUTS; remains on tamsulosin ?Denies dysuria, gross hematuria ?Denies flank, abdominal or pelvic pain ?PSA 09/02/2021 stable at 14.4 ? ? ? ?PMH: ?Past Medical History:  ?Diagnosis Date  ? Arthritis   ? Cataract   ? GERD (gastroesophageal reflux disease)   ? Hernia, hiatal   ? Hypertension   ? ? ?Surgical History: ?Past Surgical History:  ?Procedure Laterality Date  ? bicep surgery Right   ? CATARACT EXTRACTION  08/2020  ? COLONOSCOPY WITH PROPOFOL N/A 08/27/2015  ? Procedure: COLONOSCOPY WITH PROPOFOL;  Surgeon: Manya Silvas, MD;  Location: [facility];  Service: Endoscopy;  Laterality: N/A;  ? ELBOW SURGERY Left 1979  ? HERNIA REPAIR  1969  ? TOTAL HIP ARTHROPLASTY Right 08/08/2014  ? Procedure: RIGHT TOTAL HIP ARTHROPLASTY;  Surgeon: Earlie Server, MD;  Location: [facility];  Service: Orthopedics;  Laterality: Right;  ? ? ?Home Medications:  ?Allergies as of 09/08/2021   ? ?   Reactions  ? Bee Venom Anaphylaxis  ? Lisinopril Cough  ? ?  ? ?  ?Medication List  ?  ? ?  ? Accurate as of September 08, 2021  9:08 AM. If you have any questions, ask your nurse or doctor.  ?  ?  ? ?  ? ?amLODipine 5 MG tablet ?Commonly known as: NORVASC ?Take 1 tablet (5 mg total) by mouth daily. ?  ?atorvastatin 20 MG tablet ?Commonly known as: LIPITOR ?Take 1 tablet (20 mg total) by mouth daily. ?  ?CENTRUM SILVER 50+MEN PO ?Take by mouth daily. ?  ?famotidine 20 MG  tablet ?Commonly known as: PEPCID ?Take 20 mg by mouth at bedtime. ?  ?tamsulosin 0.4 MG Caps capsule ?Commonly known as: FLOMAX ?TAKE 1 CAPSULE BY MOUTH EVERY DAY ?  ?triamterene-hydrochlorothiazide 37.5-25 MG tablet ?Commonly known as: MAXZIDE-25 ?Take 1 tablet by mouth daily. ?  ? ?  ? ? ?Allergies:  ?Allergies  ?Allergen Reactions  ? Bee Venom Anaphylaxis  ? Lisinopril Cough  ? ? ?Family History: ?Family History  ?Problem Relation Age of Onset  ? Healthy Mother   ? Cancer Father   ? Stroke Father   ? Transient ischemic attack Father   ? Lung cancer Father   ? Healthy Brother   ? Healthy Brother   ? Alzheimer's disease Maternal Grandmother   ? Osteoporosis Paternal Grandmother   ? Bladder Cancer Neg Hx   ? Kidney cancer Neg Hx   ? Prostate cancer Neg Hx   ? ? ?Social History:  reports that he quit smoking about 37 years ago. His smoking use included cigarettes. He has a 5.00 pack-year smoking history. He has never used smokeless tobacco. He reports current alcohol use of about 14.0 standard drinks per week. He reports that he does not use drugs. ? ? ?Physical Exam: ?BP (!) 154/79   Pulse 82   Ht 6' (1.829 m)   Wt 225 lb (102.1 kg)   BMI  30.52 kg/m?   ?Constitutional:  Alert and oriented, No acute distress. ?HEENT: [place] AT, moist mucus membranes.  Trachea midline, no masses. ?Cardiovascular: No clubbing, cyanosis, or edema. ?Respiratory: Normal respiratory effort, no increased work of breathing. ?GU: Prostate 80+ cc, smooth without nodules.  Only lower one half palpable ?Skin: No rashes, bruises or suspicious lesions. ?Neurologic: Grossly intact, no focal deficits, moving all 4 extremities. ?Psychiatric: Normal mood and affect. ? ? ?Assessment & Plan:   ? ?1.  Elevated PSA ?Stable PSA ?Benign DRE ? ?2.  BPH with LUTS ?Stable on tamsulosin ? ?1 year follow-up with PSA ? ? ? ?Abbie Sons, MD ? ?[facility] ?[address] ?[place],  [address] ?[phone] ? ?

## 2021-12-19 ENCOUNTER — Other Ambulatory Visit: Payer: Self-pay | Admitting: Nurse Practitioner

## 2021-12-19 DIAGNOSIS — E7800 Pure hypercholesterolemia, unspecified: Secondary | ICD-10-CM

## 2021-12-19 DIAGNOSIS — I10 Essential (primary) hypertension: Secondary | ICD-10-CM

## 2021-12-21 NOTE — Telephone Encounter (Signed)
Requested Prescriptions  Pending Prescriptions Disp Refills  . triamterene-hydrochlorothiazide (MAXZIDE-25) 37.5-25 MG tablet [Pharmacy Med Name: TRIAMTERENE-HCTZ 37.5-25 MG TB] 90 tablet 1    Sig: TAKE ONE TABLET BY MOUTH DAILY     Cardiovascular: Diuretic Combos Failed - 12/19/2021  4:00 PM      Failed - Last BP in normal range    BP Readings from Last 1 Encounters:  09/08/21 (!) 154/79         Passed - K in normal range and within 180 days    Potassium  Date Value Ref Range Status  06/25/2021 3.9 3.5 - 5.2 mmol/L Final         Passed - Na in normal range and within 180 days    Sodium  Date Value Ref Range Status  06/25/2021 139 134 - 144 mmol/L Final         Passed - Cr in normal range and within 180 days    Creatinine, Ser  Date Value Ref Range Status  06/25/2021 0.82 0.76 - 1.27 mg/dL Final         Passed - Valid encounter within last 6 months    Recent Outpatient Visits          5 months ago Essential hypertension   [facility] Jon Billings, NP   12 months ago Annual physical exam   [facility] Jon Billings, NP   1 year ago Annual physical exam   [facility] [place], Clearnce Sorrel, [place]   2 years ago Annual physical exam   [facility] Fenton Malling M, [place]   3 years ago Acute idiopathic gout involving toe of left foot   [facility] [place], Clearnce Sorrel, [place]      Future Appointments            In 2 days Jon Billings, NP [employer], [place]   In 8 months Sto[place], Ronda Fairly, MD [place]           . atorvastatin (LIPITOR) 20 MG tablet [Pharmacy Med Name: ATORVASTATIN 20 MG TABLET] 90 tablet 1    Sig: TAKE ONE TABLET BY MOUTH DAILY     Cardiovascular:  Antilipid - Statins Failed - 12/19/2021  4:00 PM      Failed - Lipid Panel in normal range within the last 12 months    Cholesterol, Total  Date Value Ref Range Status  06/25/2021 140 100  - 199 mg/dL Final   LDL Chol Calc (NIH)  Date Value Ref Range Status  06/25/2021 64 0 - 99 mg/dL Final   HDL  Date Value Ref Range Status  06/25/2021 37 (L) >39 mg/dL Final   Triglycerides  Date Value Ref Range Status  06/25/2021 241 (H) 0 - 149 mg/dL Final         Passed - Patient is not pregnant      Passed - Valid encounter within last 12 months    Recent Outpatient Visits          5 months ago Essential hypertension   [facility] Jon Billings, NP   12 months ago Annual physical exam   [facility] Jon Billings, NP   1 year ago Annual physical exam   [facility] [place], Clearnce Sorrel, [place]   2 years ago Annual physical exam   [place], [place]   3 years ago Acute idiopathic gout involving toe of left foot   [facility] [place], Clearnce Sorrel, PA-C      Future Appointments            In 2 days Jon Billings, NP [facility] [place], [place]   In 8 months Stoioff, Ronda Fairly, MD [place]           . amLODipine (NORVASC) 5 MG tablet [Pharmacy Med Name: amLODIPine BESYLATE 5 MG TAB] 90 tablet 1    Sig: TAKE ONE TABLET BY MOUTH DAILY     Cardiovascular: Calcium Channel Blockers 2 Failed - 12/19/2021  4:00 PM      Failed - Last BP in normal range    BP Readings from Last 1 Encounters:  09/08/21 (!) 154/79         Passed - Last Heart Rate in normal range    Pulse Readings from Last 1 Encounters:  09/08/21 82         Passed - Valid encounter within last 6 months    Recent Outpatient Visits          5 months ago Essential hypertension   [facility] Jon Billings, NP   12 months ago Annual physical exam   [facility] Jon Billings, NP   1 year ago Annual physical exam   [place], Clearnce Sorrel, [place]   2 years ago Annual physical exam   [facility] Fenton Malling  M, [place]   3 years ago Acute idiopathic gout involving toe of left foot   [facility] [place], Clearnce Sorrel, [place]      Future Appointments            In 2 days Jon Billings, NP [employer], [place]   In 8 months [place], Ronda Fairly, MD [facility]

## 2021-12-22 NOTE — Progress Notes (Deleted)
There were no vitals taken for this visit.   Subjective:    Patient ID: Chad Matthews, male    DOB: [birth year], 61 y.o.   MRN: [id number]  HPI: Chad Matthews is a 61 y.o. male presenting on 12/23/2021 for comprehensive medical examination. Current medical complaints include:none  He currently lives with: Wife Interim Problems from his last visit: no  HYPERTENSION / HYPERLIPIDEMIA Satisfied with current treatment? yes Duration of hypertension: years BP monitoring frequency: not checking BP range:  BP medication side effects: no Past BP meds:  Maxide and amlodipine Duration of hyperlipidemia: years Cholesterol medication side effects: no Cholesterol supplements: none Past cholesterol medications: atorvastain (lipitor) Medication compliance: excellent compliance Aspirin: no Recent stressors: no Recurrent headaches: no Visual changes: no Palpitations: no Dyspnea: no Chest pain: no Lower extremity edema: no Dizzy/lightheaded: no  Depression Screen done today and results listed below:     06/25/2021    8:36 AM 12/23/2020    2:33 PM 01/09/2020    7:52 AM 12/25/2018    2:07 PM 12/21/2017    8:59 AM  Depression screen PHQ 2/9  Decreased Interest 0 0 0 0 0  Down, Depressed, Hopeless 0 0 0 0 0  PHQ - 2 Score 0 0 0 0 0  Altered sleeping 0      Tired, decreased energy 0      Change in appetite 0      Feeling bad or failure about yourself  0      Trouble concentrating 0      Moving slowly or fidgety/restless 0      Suicidal thoughts 0      PHQ-9 Score 0      Difficult doing work/chores Not difficult at all        The patient does not have a history of falls. I did complete a risk assessment for falls. A plan of care for falls was documented.   Past Medical History:  Past Medical History:  Diagnosis Date  . Arthritis   . Cataract   . GERD (gastroesophageal reflux disease)   . Hernia, hiatal   . Hypertension     Surgical History:  Past Surgical History:   Procedure Laterality Date  . bicep surgery Right   . CATARACT EXTRACTION  08/2020  . COLONOSCOPY WITH PROPOFOL N/A 08/27/2015   Procedure: COLONOSCOPY WITH PROPOFOL;  Surgeon: Manya Silvas, MD;  Location: [facility] ENDOSCOPY;  Service: Endoscopy;  Laterality: N/A;  . ELBOW SURGERY Left 1979  . HERNIA REPAIR  1969  . TOTAL HIP ARTHROPLASTY Right 08/08/2014   Procedure: RIGHT TOTAL HIP ARTHROPLASTY;  Surgeon: Earlie Server, MD;  Location: [place];  Service: Orthopedics;  Laterality: Right;    Medications:  Current Outpatient Medications on File Prior to Visit  Medication Sig  . amLODipine (NORVASC) 5 MG tablet TAKE ONE TABLET BY MOUTH DAILY  . atorvastatin (LIPITOR) 20 MG tablet TAKE ONE TABLET BY MOUTH DAILY  . famotidine (PEPCID) 20 MG tablet Take 20 mg by mouth at bedtime.   . Multiple Vitamins-Minerals (CENTRUM SILVER 50+MEN PO) Take by mouth daily.  . tamsulosin (FLOMAX) 0.4 MG CAPS capsule TAKE 1 CAPSULE BY MOUTH EVERY DAY  . triamterene-hydrochlorothiazide (MAXZIDE-25) 37.5-25 MG tablet TAKE ONE TABLET BY MOUTH DAILY   No current facility-administered medications on file prior to visit.    Allergies:  Allergies  Allergen Reactions  . Bee Venom Anaphylaxis  . Lisinopril Cough    Social History:  Social History  Socioeconomic History  . Marital status: Married    Spouse name: Hilda Blades  . Number of children: Not on file  . Years of education: College  . Highest education level: Not on file  Occupational History  . Occupation: Armed forces technical officer    Comment: Full-Time  Tobacco Use  . Smoking status: Former    Packs/day: 0.50    Years: 10.00    Total pack years: 5.00    Types: Cigarettes    Quit date: 07/25/1984    Years since quitting: 37.4  . Smokeless tobacco: Never  Vaping Use  . Vaping Use: Never used  Substance and Sexual Activity  . Alcohol use: Yes    Alcohol/week: 14.0 standard drinks of alcohol    Types: 14 Cans of beer per week    Comment: daily  . Drug  use: No  . Sexual activity: Yes    Birth control/protection: None  Other Topics Concern  . Not on file  Social History Narrative  . Not on file   Social Determinants of Health   Financial Resource Strain: Not on file  Food Insecurity: Not on file  Transportation Needs: Not on file  Physical Activity: Not on file  Stress: Not on file  Social Connections: Not on file  Intimate Partner Violence: Not on file   Social History   Tobacco Use  Smoking Status Former  . Packs/day: 0.50  . Years: 10.00  . Total pack years: 5.00  . Types: Cigarettes  . Quit date: 07/25/1984  . Years since quitting: 37.4  Smokeless Tobacco Never   Social History   Substance and Sexual Activity  Alcohol Use Yes  . Alcohol/week: 14.0 standard drinks of alcohol  . Types: 14 Cans of beer per week   Comment: daily    Family History:  Family History  Problem Relation Age of Onset  . Healthy Mother   . Cancer Father   . Stroke Father   . Transient ischemic attack Father   . Lung cancer Father   . Healthy Brother   . Healthy Brother   . Alzheimer's disease Maternal Grandmother   . Osteoporosis Paternal Grandmother   . Bladder Cancer Neg Hx   . Kidney cancer Neg Hx   . Prostate cancer Neg Hx     Past medical history, surgical history, medications, allergies, family history and social history reviewed with patient today and changes made to appropriate areas of the chart.   Review of Systems  Eyes:  Negative for blurred vision and double vision.  Respiratory:  Negative for shortness of breath.   Cardiovascular:  Negative for chest pain, palpitations and leg swelling.  Neurological:  Negative for dizziness and headaches.  All other ROS negative except what is listed above and in the HPI.      Objective:    There were no vitals taken for this visit.  Wt Readings from Last 3 Encounters:  09/08/21 225 lb (102.1 kg)  06/25/21 227 lb 12.8 oz (103.3 kg)  12/23/20 222 lb 2 oz (100.8 kg)     Physical Exam Vitals and nursing note reviewed.  Constitutional:      General: He is not in acute distress.    Appearance: Normal appearance. He is not ill-appearing, toxic-appearing or diaphoretic.  HENT:     Head: Normocephalic.     Right Ear: Tympanic membrane, ear canal and external ear normal.     Left Ear: Tympanic membrane, ear canal and external ear normal.     Nose:  Nose normal. No congestion or rhinorrhea.     Mouth/Throat:     Mouth: Mucous membranes are moist.  Eyes:     General:        Right eye: No discharge.        Left eye: No discharge.     Extraocular Movements: Extraocular movements intact.     Conjunctiva/sclera: Conjunctivae normal.     Pupils: Pupils are equal, round, and reactive to light.  Cardiovascular:     Rate and Rhythm: Normal rate and regular rhythm.     Heart sounds: No murmur heard. Pulmonary:     Effort: Pulmonary effort is normal. No respiratory distress.     Breath sounds: Normal breath sounds. No wheezing, rhonchi or rales.  Abdominal:     General: Abdomen is flat. Bowel sounds are normal. There is no distension.     Palpations: Abdomen is soft.     Tenderness: There is no abdominal tenderness. There is no guarding.  Musculoskeletal:     Cervical back: Normal range of motion and neck supple.  Skin:    General: Skin is warm and dry.     Capillary Refill: Capillary refill takes less than 2 seconds.  Neurological:     General: No focal deficit present.     Mental Status: He is alert and oriented to person, place, and time.     Cranial Nerves: No cranial nerve deficit.     Motor: No weakness.     Deep Tendon Reflexes: Reflexes normal.  Psychiatric:        Mood and Affect: Mood normal.        Behavior: Behavior normal.        Thought Content: Thought content normal.        Judgment: Judgment normal.    Results for orders placed or performed in visit on 09/02/21  PSA  Result Value Ref Range   Prostate Specific Ag, Serum 14.4 (H) 0.0 -  4.0 ng/mL      Assessment & Plan:   Problem List Items Addressed This Visit      Cardiovascular and Mediastinum   Essential hypertension - Primary     Other   Hypercholesteremia   Prediabetes     Discussed aspirin prophylaxis for myocardial infarction prevention and decision was it was not indicated  LABORATORY TESTING:  Health maintenance labs ordered today as discussed above.   The natural history of prostate cancer and ongoing controversy regarding screening and potential treatment outcomes of prostate cancer has been discussed with the patient. The meaning of a false positive PSA and a false negative PSA has been discussed. He indicates understanding of the limitations of this screening test and wishes to proceed with screening PSA testing.   IMMUNIZATIONS:   - Tdap: Tetanus vaccination status reviewed: last tetanus booster within 10 years. - Influenza: Postponed to flu season - Pneumovax: Not applicable - Prevnar: Not applicable - HPV: Not applicable - Zostavax vaccine:  not up to date  SCREENING: - Colonoscopy: Up to date  Discussed with patient purpose of the colonoscopy is to detect colon cancer at curable precancerous or early stages   - AAA Screening: Not applicable  -Hearing Test: Not applicable  -Spirometry: Not applicable   PATIENT COUNSELING:    Sexuality: Discussed sexually transmitted diseases, partner selection, use of condoms, avoidance of unintended pregnancy  and contraceptive alternatives.   Advised to avoid cigarette smoking.  I discussed with the patient that most people either abstain from alcohol or  drink within safe limits (<=14/week and <=4 drinks/occasion for males, <=7/weeks and <= 3 drinks/occasion for females) and that the risk for alcohol disorders and other health effects rises proportionally with the number of drinks per week and how often a drinker exceeds daily limits.  Discussed cessation/primary prevention of drug use and  availability of treatment for abuse.   Diet: Encouraged to adjust caloric intake to maintain  or achieve ideal body weight, to reduce intake of dietary saturated fat and total fat, to limit sodium intake by avoiding high sodium foods and not adding table salt, and to maintain adequate dietary potassium and calcium preferably from fresh fruits, vegetables, and low-fat dairy products.    stressed the importance of regular exercise  Injury prevention: Discussed safety belts, safety helmets, smoke detector, smoking near bedding or upholstery.   Dental health: Discussed importance of regular tooth brushing, flossing, and dental visits.   Follow up plan: NEXT PREVENTATIVE PHYSICAL DUE IN 1 YEAR. No follow-ups on file.

## 2021-12-23 ENCOUNTER — Encounter: Payer: BLUE CROSS/BLUE SHIELD | Admitting: Nurse Practitioner

## 2021-12-27 ENCOUNTER — Encounter: Payer: Self-pay | Admitting: Nurse Practitioner

## 2021-12-27 ENCOUNTER — Ambulatory Visit (INDEPENDENT_AMBULATORY_CARE_PROVIDER_SITE_OTHER): Payer: BLUE CROSS/BLUE SHIELD | Admitting: Nurse Practitioner

## 2021-12-27 VITALS — BP 157/92 | HR 80 | Temp 97.9°F | Ht 71.46 in | Wt 219.7 lb

## 2021-12-27 DIAGNOSIS — Z0000 Encounter for general adult medical examination without abnormal findings: Secondary | ICD-10-CM | POA: Diagnosis not present

## 2021-12-27 DIAGNOSIS — I10 Essential (primary) hypertension: Secondary | ICD-10-CM

## 2021-12-27 DIAGNOSIS — Z23 Encounter for immunization: Secondary | ICD-10-CM

## 2021-12-27 DIAGNOSIS — R7303 Prediabetes: Secondary | ICD-10-CM | POA: Diagnosis not present

## 2021-12-27 DIAGNOSIS — N401 Enlarged prostate with lower urinary tract symptoms: Secondary | ICD-10-CM

## 2021-12-27 DIAGNOSIS — E7800 Pure hypercholesterolemia, unspecified: Secondary | ICD-10-CM

## 2021-12-27 LAB — URINALYSIS, ROUTINE W REFLEX MICROSCOPIC
Bilirubin, UA: NEGATIVE
Glucose, UA: NEGATIVE
Leukocytes,UA: NEGATIVE
Nitrite, UA: NEGATIVE
RBC, UA: NEGATIVE
Specific Gravity, UA: 1.015 (ref 1.005–1.030)
Urobilinogen, Ur: 0.2 mg/dL (ref 0.2–1.0)
pH, UA: 7 (ref 5.0–7.5)

## 2021-12-27 MED ORDER — ATORVASTATIN CALCIUM 20 MG PO TABS: 20.0000 mg | ORAL_TABLET | Freq: Every day | ORAL | 1 refills | Status: DC

## 2021-12-27 MED ORDER — AMLODIPINE BESYLATE 5 MG PO TABS: 5.0000 mg | ORAL_TABLET | Freq: Every day | ORAL | 1 refills | Status: DC

## 2021-12-27 MED ORDER — TRIAMTERENE-HCTZ 37.5-25 MG PO TABS: 1.0000 | ORAL_TABLET | Freq: Every day | ORAL | 1 refills | Status: DC

## 2021-12-27 MED ORDER — TAMSULOSIN HCL 0.4 MG PO CAPS: 0.4000 mg | ORAL_CAPSULE | Freq: Every day | ORAL | 1 refills | Status: DC

## 2021-12-27 NOTE — Assessment & Plan Note (Signed)
Labs ordered at visit today.  Will make recommendations based on lab results.   

## 2021-12-27 NOTE — Assessment & Plan Note (Signed)
Chronic. Elevated at visit today.  Patient has a life stressor.  Will follow up in 1 month for reevaluation.  Call sooner if concerns arise.

## 2021-12-27 NOTE — Assessment & Plan Note (Signed)
Chronic.  Controlled.  Continue with current medication regimen of Atorvastatin.  Refills sent today.  Labs ordered today.  Return to clinic in 6 months for reevaluation.  Call sooner if concerns arise.   

## 2021-12-27 NOTE — Progress Notes (Unsigned)
BP (!) 157/92   Pulse 80   Temp 97.9 F (36.6 C) (Oral)   Ht 5' 11.46" (1.815 m)   Wt 219 lb 11.2 oz (99.7 kg)   SpO2 98%   BMI 30.25 kg/m    Subjective:    Patient ID: Chad Matthews, male    DOB: [birth year], 61 y.o.   MRN: [id number]  HPI: Chad Matthews is a 61 y.o. male presenting on 12/27/2021 for comprehensive medical examination. Current medical complaints include:none  He currently lives with: Interim Problems from his last visit: no  HYPERTENSION / HYPERLIPIDEMIA Blood pressure is up due to being very stressed with his job. Satisfied with current treatment? yes Duration of hypertension: years BP monitoring frequency: not checking BP range:  BP medication side effects: no Past BP meds:  Maxide and amlodipine Duration of hyperlipidemia: years Cholesterol medication side effects: no Cholesterol supplements: none Past cholesterol medications: atorvastain (lipitor) Medication compliance: excellent compliance Aspirin: no Recent stressors: no Recurrent headaches: no Visual changes: no Palpitations: no Dyspnea: no Chest pain: no Lower extremity edema: no Dizzy/lightheaded: no  Depression Screen done today and results listed below:     12/27/2021    4:16 PM 06/25/2021    8:36 AM 12/23/2020    2:33 PM 01/09/2020    7:52 AM 12/25/2018    2:07 PM  Depression screen PHQ 2/9  Decreased Interest 0 0 0 0 0  Down, Depressed, Hopeless 0 0 0 0 0  PHQ - 2 Score 0 0 0 0 0  Altered sleeping 0 0     Tired, decreased energy 0 0     Change in appetite 0 0     Feeling bad or failure about yourself  0 0     Trouble concentrating 0 0     Moving slowly or fidgety/restless 0 0     Suicidal thoughts 0 0     PHQ-9 Score 0 0     Difficult doing work/chores Not difficult at all Not difficult at all       The patient does not have a history of falls. I did complete a risk assessment for falls. A plan of care for falls was documented.   Past Medical History:  Past  Medical History:  Diagnosis Date   Arthritis    Cataract    GERD (gastroesophageal reflux disease)    Hernia, hiatal    Hypertension     Surgical History:  Past Surgical History:  Procedure Laterality Date   bicep surgery Right    CATARACT EXTRACTION  08/2020   COLONOSCOPY WITH PROPOFOL N/A 08/27/2015   Procedure: COLONOSCOPY WITH PROPOFOL;  Surgeon: Manya Silvas, MD;  Location: [facility];  Service: Endoscopy;  Laterality: N/A;   ELBOW SURGERY Left [place]   TOTAL HIP ARTHROPLASTY Right 08/08/2014   Procedure: RIGHT TOTAL HIP ARTHROPLASTY;  Surgeon: Earlie Server, MD;  Location: [facility];  Service: Orthopedics;  Laterality: Right;    Medications:  Current Outpatient Medications on File Prior to Visit  Medication Sig   famotidine (PEPCID) 20 MG tablet Take 20 mg by mouth at bedtime.    Multiple Vitamins-Minerals (CENTRUM SILVER 50+MEN PO) Take by mouth daily.   No current facility-administered medications on file prior to visit.    Allergies:  Allergies  Allergen Reactions   Bee Venom Anaphylaxis   Lisinopril Cough    Social History:  Social History   Socioeconomic History   Marital status: Married  Spouse name: Hilda Blades   Number of children: Not on file   Years of education: College   Highest education level: Not on file  Occupational History   Occupation: Armed forces technical officer    Comment: Full-Time  Tobacco Use   Smoking status: Former    Packs/day: 0.50    Years: 10.00    Total pack years: 5.00    Types: Cigarettes    Quit date: 07/25/1984    Years since quitting: 37.4   Smokeless tobacco: Never  Vaping Use   Vaping Use: Never used  Substance and Sexual Activity   Alcohol use: Yes    Alcohol/week: 14.0 standard drinks of alcohol    Types: 14 Cans of beer per week    Comment: daily   Drug use: No   Sexual activity: Yes    Birth control/protection: None  Other Topics Concern   Not on file  Social History Narrative   Not on  file   Social Determinants of Health   Financial Resource Strain: Not on file  Food Insecurity: Not on file  Transportation Needs: Not on file  Physical Activity: Not on file  Stress: Not on file  Social Connections: Not on file  Intimate Partner Violence: Not on file   Social History   Tobacco Use  Smoking Status Former   Packs/day: 0.50   Years: 10.00   Total pack years: 5.00   Types: Cigarettes   Quit date: 07/25/1984   Years since quitting: 37.4  Smokeless Tobacco Never   Social History   Substance and Sexual Activity  Alcohol Use Yes   Alcohol/week: 14.0 standard drinks of alcohol   Types: 14 Cans of beer per week   Comment: daily    Family History:  Family History  Problem Relation Age of Onset   Healthy Mother    Cancer Father    Stroke Father    Transient ischemic attack Father    Lung cancer Father    Healthy Brother    Healthy Brother    Alzheimer's disease Maternal Grandmother    Osteoporosis Paternal Grandmother    Bladder Cancer Neg Hx    Kidney cancer Neg Hx    Prostate cancer Neg Hx     Past medical history, surgical history, medications, allergies, family history and social history reviewed with patient today and changes made to appropriate areas of the chart.   Review of Systems  Eyes:  Negative for blurred vision and double vision.  Respiratory:  Negative for shortness of breath.   Cardiovascular:  Negative for chest pain, palpitations and leg swelling.  Neurological:  Negative for dizziness and headaches.   All other ROS negative except what is listed above and in the HPI.      Objective:    BP (!) 157/92   Pulse 80   Temp 97.9 F (36.6 C) (Oral)   Ht 5' 11.46" (1.815 m)   Wt 219 lb 11.2 oz (99.7 kg)   SpO2 98%   BMI 30.25 kg/m   Wt Readings from Last 3 Encounters:  12/27/21 219 lb 11.2 oz (99.7 kg)  09/08/21 225 lb (102.1 kg)  06/25/21 227 lb 12.8 oz (103.3 kg)    Physical Exam Vitals and nursing note reviewed.   Constitutional:      General: He is not in acute distress.    Appearance: Normal appearance. He is not ill-appearing, toxic-appearing or diaphoretic.  HENT:     Head: Normocephalic.     Right Ear: Tympanic membrane, ear  canal and external ear normal.     Left Ear: Tympanic membrane, ear canal and external ear normal.     Nose: Nose normal. No congestion or rhinorrhea.     Mouth/Throat:     Mouth: Mucous membranes are moist.  Eyes:     General:        Right eye: No discharge.        Left eye: No discharge.     Extraocular Movements: Extraocular movements intact.     Conjunctiva/sclera: Conjunctivae normal.     Pupils: Pupils are equal, round, and reactive to light.  Cardiovascular:     Rate and Rhythm: Normal rate and regular rhythm.     Heart sounds: No murmur heard. Pulmonary:     Effort: Pulmonary effort is normal. No respiratory distress.     Breath sounds: Normal breath sounds. No wheezing, rhonchi or rales.  Abdominal:     General: Abdomen is flat. Bowel sounds are normal. There is no distension.     Palpations: Abdomen is soft.     Tenderness: There is no abdominal tenderness. There is no guarding.  Musculoskeletal:     Cervical back: Normal range of motion and neck supple.  Skin:    General: Skin is warm and dry.     Capillary Refill: Capillary refill takes less than 2 seconds.  Neurological:     General: No focal deficit present.     Mental Status: He is alert and oriented to person, place, and time.     Cranial Nerves: No cranial nerve deficit.     Motor: No weakness.     Deep Tendon Reflexes: Reflexes normal.  Psychiatric:        Mood and Affect: Mood normal.        Behavior: Behavior normal.        Thought Content: Thought content normal.        Judgment: Judgment normal.     Results for orders placed or performed in visit on 12/27/21  TSH  Result Value Ref Range   TSH 1.680 0.450 - 4.500 uIU/mL  PSA  Result Value Ref Range   Prostate Specific Ag,  Serum 12.8 (H) 0.0 - 4.0 ng/mL  Lipid panel  Result Value Ref Range   Cholesterol, Total 135 100 - 199 mg/dL   Triglycerides 162 (H) 0 - 149 mg/dL   HDL 41 >39 mg/dL   VLDL Cholesterol Cal 28 5 - 40 mg/dL   LDL Chol Calc (NIH) 66 0 - 99 mg/dL   Chol/HDL Ratio 3.3 0.0 - 5.0 ratio  CBC with Differential/Platelet  Result Value Ref Range   WBC 6.3 3.4 - 10.8 x10E3/uL   RBC 4.44 4.14 - 5.80 x10E6/uL   Hemoglobin 14.3 13.0 - 17.7 g/dL   Hematocrit 42.6 37.5 - 51.0 %   MCV 96 79 - 97 fL   MCH 32.2 26.6 - 33.0 pg   MCHC 33.6 31.5 - 35.7 g/dL   RDW 12.3 11.6 - 15.4 %   Platelets 89 (LL) 150 - 450 x10E3/uL   Neutrophils 69 Not Estab. %   Lymphs 20 Not Estab. %   Monocytes 8 Not Estab. %   Eos 2 Not Estab. %   Basos 1 Not Estab. %   Neutrophils Absolute 4.4 1.4 - 7.0 x10E3/uL   Lymphocytes Absolute 1.2 0.7 - 3.1 x10E3/uL   Monocytes Absolute 0.5 0.1 - 0.9 x10E3/uL   EOS (ABSOLUTE) 0.1 0.0 - 0.4 x10E3/uL   Basophils Absolute 0.0 0.0 -  0.2 x10E3/uL   Immature Granulocytes 0 Not Estab. %   Immature Grans (Abs) 0.0 0.0 - 0.1 x10E3/uL   Hematology Comments: Note:   Comprehensive metabolic panel  Result Value Ref Range   Glucose 115 (H) 70 - 99 mg/dL   BUN 14 8 - 27 mg/dL   Creatinine, Ser 0.89 0.76 - 1.27 mg/dL   eGFR 97 >59 mL/min/1.73   BUN/Creatinine Ratio 16 10 - 24   Sodium 138 134 - 144 mmol/L   Potassium 3.4 (L) 3.5 - 5.2 mmol/L   Chloride 99 96 - 106 mmol/L   CO2 23 20 - 29 mmol/L   Calcium 9.4 8.6 - 10.2 mg/dL   Total Protein 7.1 6.0 - 8.5 g/dL   Albumin 4.7 3.9 - 4.9 g/dL   Globulin, Total 2.4 1.5 - 4.5 g/dL   Albumin/Globulin Ratio 2.0 1.2 - 2.2   Bilirubin Total 1.3 (H) 0.0 - 1.2 mg/dL   Alkaline Phosphatase 116 44 - 121 IU/L   AST 29 0 - 40 IU/L   ALT 28 0 - 44 IU/L  Urinalysis, Routine w reflex microscopic  Result Value Ref Range   Specific Gravity, UA 1.015 1.005 - 1.030   pH, UA 7.0 5.0 - 7.5   Color, UA Yellow Yellow   Appearance Ur Clear Clear    Leukocytes,UA Negative Negative   Protein,UA Trace (A) Negative/Trace   Glucose, UA Negative Negative   Ketones, UA Trace (A) Negative   RBC, UA Negative Negative   Bilirubin, UA Negative Negative   Urobilinogen, Ur 0.2 0.2 - 1.0 mg/dL   Nitrite, UA Negative Negative      Assessment & Plan:   Problem List Items Addressed This Visit       Cardiovascular and Mediastinum   Essential hypertension    Chronic. Elevated at visit today.  Patient has a life stressor.  Will follow up in 1 month for reevaluation.  Call sooner if concerns arise.        Relevant Medications   amLODipine (NORVASC) 5 MG tablet   atorvastatin (LIPITOR) 20 MG tablet   triamterene-hydrochlorothiazide (MAXZIDE-25) 37.5-25 MG tablet     Other   Hypercholesteremia    Chronic.  Controlled.  Continue with current medication regimen of Atorvastatin.  Refills sent today.  Labs ordered today.  Return to clinic in 6 months for reevaluation.  Call sooner if concerns arise.        Relevant Medications   amLODipine (NORVASC) 5 MG tablet   atorvastatin (LIPITOR) 20 MG tablet   triamterene-hydrochlorothiazide (MAXZIDE-25) 37.5-25 MG tablet   Other Relevant Orders   Lipid panel (Completed)   Prediabetes    Labs ordered at visit today.  Will make recommendations based on lab results.        Relevant Orders   HgB A1c   Other Visit Diagnoses     Annual physical exam    -  Primary   Health maintenance reviewed during visit today.  Labs ordered. Shingles shot given. Colonoscopy up to date.   Relevant Orders   TSH (Completed)   PSA (Completed)   Lipid panel (Completed)   CBC with Differential/Platelet (Completed)   Comprehensive metabolic panel (Completed)   Urinalysis, Routine w reflex microscopic (Completed)   Need for shingles vaccine       Relevant Orders   Zoster Recombinant (Shingrix ) (Completed)   Hypercholesterolemia       Relevant Medications   amLODipine (NORVASC) 5 MG tablet   atorvastatin  (LIPITOR) 20  MG tablet   triamterene-hydrochlorothiazide (MAXZIDE-25) 37.5-25 MG tablet   Benign prostatic hyperplasia with lower urinary tract symptoms, symptom details unspecified       Relevant Medications   tamsulosin (FLOMAX) 0.4 MG CAPS capsule        Discussed aspirin prophylaxis for myocardial infarction prevention and decision was it was not indicated  LABORATORY TESTING:  Health maintenance labs ordered today as discussed above.   The natural history of prostate cancer and ongoing controversy regarding screening and potential treatment outcomes of prostate cancer has been discussed with the patient. The meaning of a false positive PSA and a false negative PSA has been discussed. He indicates understanding of the limitations of this screening test and wishes to proceed with screening PSA testing.   IMMUNIZATIONS:   - Tdap: Tetanus vaccination status reviewed: last tetanus booster within 10 years. - Influenza: Postponed to flu season - Pneumovax: Not applicable - Prevnar: Not applicable - COVID: Not applicable - HPV: Not applicable - Shingrix vaccine: Up to date  SCREENING: - Colonoscopy: Up to date  Discussed with patient purpose of the colonoscopy is to detect colon cancer at curable precancerous or early stages   - AAA Screening: Not applicable  -Hearing Test: Not applicable  -Spirometry: Not applicable   PATIENT COUNSELING:    Sexuality: Discussed sexually transmitted diseases, partner selection, use of condoms, avoidance of unintended pregnancy  and contraceptive alternatives.   Advised to avoid cigarette smoking.  I discussed with the patient that most people either abstain from alcohol or drink within safe limits (<=14/week and <=4 drinks/occasion for males, <=7/weeks and <= 3 drinks/occasion for females) and that the risk for alcohol disorders and other health effects rises proportionally with the number of drinks per week and how often a drinker exceeds daily  limits.  Discussed cessation/primary prevention of drug use and availability of treatment for abuse.   Diet: Encouraged to adjust caloric intake to maintain  or achieve ideal body weight, to reduce intake of dietary saturated fat and total fat, to limit sodium intake by avoiding high sodium foods and not adding table salt, and to maintain adequate dietary potassium and calcium preferably from fresh fruits, vegetables, and low-fat dairy products.    stressed the importance of regular exercise  Injury prevention: Discussed safety belts, safety helmets, smoke detector, smoking near bedding or upholstery.   Dental health: Discussed importance of regular tooth brushing, flossing, and dental visits.   Follow up plan: NEXT PREVENTATIVE PHYSICAL DUE IN 1 YEAR. Return in about 1 month (around 01/27/2022) for BP Check.

## 2021-12-28 LAB — CBC WITH DIFFERENTIAL/PLATELET
Basophils Absolute: 0 10*3/uL (ref 0.0–0.2)
Basos: 1 %
EOS (ABSOLUTE): 0.1 10*3/uL (ref 0.0–0.4)
Eos: 2 %
Hematocrit: 42.6 % (ref 37.5–51.0)
Hemoglobin: 14.3 g/dL (ref 13.0–17.7)
Immature Grans (Abs): 0 10*3/uL (ref 0.0–0.1)
Immature Granulocytes: 0 %
Lymphocytes Absolute: 1.2 10*3/uL (ref 0.7–3.1)
Lymphs: 20 %
MCH: 32.2 pg (ref 26.6–33.0)
MCHC: 33.6 g/dL (ref 31.5–35.7)
MCV: 96 fL (ref 79–97)
Monocytes Absolute: 0.5 10*3/uL (ref 0.1–0.9)
Monocytes: 8 %
Neutrophils Absolute: 4.4 10*3/uL (ref 1.4–7.0)
Neutrophils: 69 %
Platelets: 89 10*3/uL — CL (ref 150–450)
RBC: 4.44 x10E6/uL (ref 4.14–5.80)
RDW: 12.3 % (ref 11.6–15.4)
WBC: 6.3 10*3/uL (ref 3.4–10.8)

## 2021-12-28 LAB — COMPREHENSIVE METABOLIC PANEL
ALT: 28 IU/L (ref 0–44)
AST: 29 IU/L (ref 0–40)
Albumin/Globulin Ratio: 2 (ref 1.2–2.2)
Albumin: 4.7 g/dL (ref 3.9–4.9)
Alkaline Phosphatase: 116 IU/L (ref 44–121)
BUN/Creatinine Ratio: 16 (ref 10–24)
BUN: 14 mg/dL (ref 8–27)
Bilirubin Total: 1.3 mg/dL — ABNORMAL HIGH (ref 0.0–1.2)
CO2: 23 mmol/L (ref 20–29)
Calcium: 9.4 mg/dL (ref 8.6–10.2)
Chloride: 99 mmol/L (ref 96–106)
Creatinine, Ser: 0.89 mg/dL (ref 0.76–1.27)
Globulin, Total: 2.4 g/dL (ref 1.5–4.5)
Glucose: 115 mg/dL — ABNORMAL HIGH (ref 70–99)
Potassium: 3.4 mmol/L — ABNORMAL LOW (ref 3.5–5.2)
Sodium: 138 mmol/L (ref 134–144)
Total Protein: 7.1 g/dL (ref 6.0–8.5)
eGFR: 97 mL/min/{1.73_m2} (ref >59)

## 2021-12-28 LAB — LIPID PANEL
Chol/HDL Ratio: 3.3 ratio (ref 0.0–5.0)
Cholesterol, Total: 135 mg/dL (ref 100–199)
HDL: 41 mg/dL (ref >39)
LDL Chol Calc (NIH): 66 mg/dL (ref 0–99)
Triglycerides: 162 mg/dL — ABNORMAL HIGH (ref 0–149)
VLDL Cholesterol Cal: 28 mg/dL (ref 5–40)

## 2021-12-28 LAB — TSH: TSH: 1.68 u[IU]/mL (ref 0.450–4.500)

## 2021-12-28 LAB — PSA: Prostate Specific Ag, Serum: 12.8 ng/mL — ABNORMAL HIGH (ref 0.0–4.0)

## 2021-12-28 NOTE — Progress Notes (Signed)
Please let patient know that overall his lab work looks good.  His PSA has improved some which is great news. His platelets are lower than they have been.  I'd like for him to see Hematology for this.  If he is okay with it, I will place the referral.   Otherwise, his cholesterol improved from prior.  His diet changes are making a difference.  No other concerns at this time.  Follow up as discussed.

## 2022-02-03 ENCOUNTER — Ambulatory Visit: Payer: Self-pay | Admitting: *Deleted

## 2022-02-03 NOTE — Telephone Encounter (Signed)
  Chief Complaint: Sinus PAin Symptoms: Sinus drainage, yellowish. Frontal headache,pressure over eyes, ears. States has sinus issues "Just need to clear this up." Pt cancelled appt today with PCP "I didn't want to be blowing all this everywhere."  Frequency: Tuesday night Pertinent Negatives: Patient denies cough,fever, sore throat Disposition: '[]'$ ED /'[]'$ Urgent Care (no appt availability in office) / '[]'$ Appointment(In office/virtual)/ '[]'$  [facility]/ '[]'$ Home Care/ '[]'$ Refused Recommended Disposition /'[]'$ [facility]/ '[x]'$  Follow-up with PCP Additional Notes: Pt requesting recommendations for OTC meds. Home care advise provided, would like PCPs recommendations. Advised may need to be seen. "Let's see what Santiago Glad says." Please advise. Reason for Disposition  [1] Sinus congestion as part of a cold AND [2] present < 10 days  Answer Assessment - Initial Assessment Questions 1. LOCATION: "Where does it hurt?"      Frontal headache, eyes, ear 2. ONSET: "When did the sinus pain start?"  (e.g., hours, days)      Tuesday night 3. SEVERITY: "How bad is the pain?"   (Scale 1-10; mild, moderate or severe)   - MILD (1-3): doesn't interfere with normal activities    - MODERATE (4-7): interferes with normal activities (e.g., work or school) or awakens from sleep   - SEVERE (8-10): excruciating pain and patient unable to do any normal activities        3/10 4. RECURRENT SYMPTOM: "Have you ever had sinus problems before?" If Yes, ask: "When was the last time?" and "What happened that time?"      Yes 5. NASAL CONGESTION: "Is the nose blocked?" If Yes, ask: "Can you open it or must you breathe through your mouth?"      6. NASAL DISCHARGE: "Do you have discharge from your nose?" If so ask, "What color?"     Yellowish 7. FEVER: "Do you have a fever?" If Yes, ask: "What is it, how was it measured, and when did it start?"      no 8. OTHER SYMPTOMS: "Do you have any other symptoms?" (e.g., sore  throat, cough, earache, difficulty breathing)     Eyes and ears  Protocols used: Sinus Pain or Congestion-A-AH

## 2022-02-03 NOTE — Telephone Encounter (Signed)
Needs an appt.  Can be virtual.

## 2022-02-03 NOTE — Telephone Encounter (Signed)
2nd voicemail left.

## 2022-02-03 NOTE — Telephone Encounter (Signed)
LVMTRC 

## 2022-02-03 NOTE — Telephone Encounter (Signed)
Answer Assessment - Initial Assessment Questions 1. SYMPTOM: "What's the main symptom you're concerned about?" (e.g., runny nose, stuffiness, sneezing, itching)     Running nose Pt states he already spoke with a nurse and then bought some allergy spray that helped a lot and he is breathing good and has appt next week.  Protocols used: Nasal Allergies (Hay Fever)-A-AH

## 2022-02-03 NOTE — Telephone Encounter (Signed)
VM left for pt

## 2022-02-04 ENCOUNTER — Ambulatory Visit: Payer: BLUE CROSS/BLUE SHIELD | Admitting: Nurse Practitioner

## 2022-02-08 ENCOUNTER — Ambulatory Visit: Payer: BLUE CROSS/BLUE SHIELD | Admitting: Nurse Practitioner

## 2022-02-08 ENCOUNTER — Encounter: Payer: Self-pay | Admitting: Nurse Practitioner

## 2022-02-08 VITALS — BP 129/76 | HR 91 | Temp 98.7°F | Wt 213.8 lb

## 2022-02-08 DIAGNOSIS — I10 Essential (primary) hypertension: Secondary | ICD-10-CM | POA: Diagnosis not present

## 2022-02-08 NOTE — Assessment & Plan Note (Signed)
Chronic.  Controlled.  Continue with current medication regimen of Amlodipine, Maxzide and HCTZ.  Labs ordered today.  Return to clinic in 5 months for reevaluation.  Call sooner if concerns arise.

## 2022-02-08 NOTE — Progress Notes (Signed)
BP 129/76 (BP Location: Right Arm, Cuff Size: Normal)   Pulse 91   Temp 98.7 F (37.1 C) (Oral)   Wt 213 lb 12.8 oz (97 kg)   SpO2 98%   BMI 29.44 kg/m    Subjective:    Patient ID: Chad Matthews, male    DOB: [birth year], 61 y.o.   MRN: [id number]  HPI: Chad Matthews is a 61 y.o. male  Chief Complaint  Patient presents with   Hypertension    1 month f/up   HYPERTENSION with Chronic Kidney Disease Hypertension status: controlled  Satisfied with current treatment? yes Duration of hypertension: years BP monitoring frequency:  not checking BP range:  BP medication side effects:  no Medication compliance: excellent compliance Previous BP meds: maxzide and HCTZ and amlodipine Aspirin: no Recurrent headaches: no Visual changes: no Palpitations: no Dyspnea: no Chest pain: no Lower extremity edema: no Dizzy/lightheaded: no  Relevant past medical, surgical, family and social history reviewed and updated as indicated. Interim medical history since our last visit reviewed. Allergies and medications reviewed and updated.  Review of Systems  Eyes:  Negative for visual disturbance.  Respiratory:  Negative for shortness of breath.   Cardiovascular:  Negative for chest pain and leg swelling.  Neurological:  Negative for light-headedness and headaches.    Per HPI unless specifically indicated above     Objective:    BP 129/76 (BP Location: Right Arm, Cuff Size: Normal)   Pulse 91   Temp 98.7 F (37.1 C) (Oral)   Wt 213 lb 12.8 oz (97 kg)   SpO2 98%   BMI 29.44 kg/m   Wt Readings from Last 3 Encounters:  02/08/22 213 lb 12.8 oz (97 kg)  12/27/21 219 lb 11.2 oz (99.7 kg)  09/08/21 225 lb (102.1 kg)    Physical Exam Vitals and nursing note reviewed.  Constitutional:      General: He is not in acute distress.    Appearance: Normal appearance. He is not ill-appearing, toxic-appearing or diaphoretic.  HENT:     Head: Normocephalic.     Right Ear: External  ear normal.     Left Ear: External ear normal.     Nose: Nose normal. No congestion or rhinorrhea.     Mouth/Throat:     Mouth: Mucous membranes are moist.  Eyes:     General:        Right eye: No discharge.        Left eye: No discharge.     Extraocular Movements: Extraocular movements intact.     Conjunctiva/sclera: Conjunctivae normal.     Pupils: Pupils are equal, round, and reactive to light.  Cardiovascular:     Rate and Rhythm: Normal rate and regular rhythm.     Heart sounds: No murmur heard. Pulmonary:     Effort: Pulmonary effort is normal. No respiratory distress.     Breath sounds: Normal breath sounds. No wheezing, rhonchi or rales.  Abdominal:     General: Abdomen is flat. Bowel sounds are normal.  Musculoskeletal:     Cervical back: Normal range of motion and neck supple.  Skin:    General: Skin is warm and dry.     Capillary Refill: Capillary refill takes less than 2 seconds.  Neurological:     General: No focal deficit present.     Mental Status: He is alert and oriented to person, place, and time.  Psychiatric:        Mood and Affect: Mood  normal.        Behavior: Behavior normal.        Thought Content: Thought content normal.        Judgment: Judgment normal.     Results for orders placed or performed in visit on 12/27/21  TSH  Result Value Ref Range   TSH 1.680 0.450 - 4.500 uIU/mL  PSA  Result Value Ref Range   Prostate Specific Ag, Serum 12.8 (H) 0.0 - 4.0 ng/mL  Lipid panel  Result Value Ref Range   Cholesterol, Total 135 100 - 199 mg/dL   Triglycerides 162 (H) 0 - 149 mg/dL   HDL 41 >39 mg/dL   VLDL Cholesterol Cal 28 5 - 40 mg/dL   LDL Chol Calc (NIH) 66 0 - 99 mg/dL   Chol/HDL Ratio 3.3 0.0 - 5.0 ratio  CBC with Differential/Platelet  Result Value Ref Range   WBC 6.3 3.4 - 10.8 x10E3/uL   RBC 4.44 4.14 - 5.80 x10E6/uL   Hemoglobin 14.3 13.0 - 17.7 g/dL   Hematocrit 42.6 37.5 - 51.0 %   MCV 96 79 - 97 fL   MCH 32.2 26.6 - 33.0 pg    MCHC 33.6 31.5 - 35.7 g/dL   RDW 12.3 11.6 - 15.4 %   Platelets 89 (LL) 150 - 450 x10E3/uL   Neutrophils 69 Not Estab. %   Lymphs 20 Not Estab. %   Monocytes 8 Not Estab. %   Eos 2 Not Estab. %   Basos 1 Not Estab. %   Neutrophils Absolute 4.4 1.4 - 7.0 x10E3/uL   Lymphocytes Absolute 1.2 0.7 - 3.1 x10E3/uL   Monocytes Absolute 0.5 0.1 - 0.9 x10E3/uL   EOS (ABSOLUTE) 0.1 0.0 - 0.4 x10E3/uL   Basophils Absolute 0.0 0.0 - 0.2 x10E3/uL   Immature Granulocytes 0 Not Estab. %   Immature Grans (Abs) 0.0 0.0 - 0.1 x10E3/uL   Hematology Comments: Note:   Comprehensive metabolic panel  Result Value Ref Range   Glucose 115 (H) 70 - 99 mg/dL   BUN 14 8 - 27 mg/dL   Creatinine, Ser 0.89 0.76 - 1.27 mg/dL   eGFR 97 >59 mL/min/1.73   BUN/Creatinine Ratio 16 10 - 24   Sodium 138 134 - 144 mmol/L   Potassium 3.4 (L) 3.5 - 5.2 mmol/L   Chloride 99 96 - 106 mmol/L   CO2 23 20 - 29 mmol/L   Calcium 9.4 8.6 - 10.2 mg/dL   Total Protein 7.1 6.0 - 8.5 g/dL   Albumin 4.7 3.9 - 4.9 g/dL   Globulin, Total 2.4 1.5 - 4.5 g/dL   Albumin/Globulin Ratio 2.0 1.2 - 2.2   Bilirubin Total 1.3 (H) 0.0 - 1.2 mg/dL   Alkaline Phosphatase 116 44 - 121 IU/L   AST 29 0 - 40 IU/L   ALT 28 0 - 44 IU/L  Urinalysis, Routine w reflex microscopic  Result Value Ref Range   Specific Gravity, UA 1.015 1.005 - 1.030   pH, UA 7.0 5.0 - 7.5   Color, UA Yellow Yellow   Appearance Ur Clear Clear   Leukocytes,UA Negative Negative   Protein,UA Trace (A) Negative/Trace   Glucose, UA Negative Negative   Ketones, UA Trace (A) Negative   RBC, UA Negative Negative   Bilirubin, UA Negative Negative   Urobilinogen, Ur 0.2 0.2 - 1.0 mg/dL   Nitrite, UA Negative Negative      Assessment & Plan:   Problem List Items Addressed This Visit  Cardiovascular and Mediastinum   Essential hypertension - Primary    Chronic.  Controlled.  Continue with current medication regimen of Amlodipine, Maxzide and HCTZ.  Labs ordered  today.  Return to clinic in 5 months for reevaluation.  Call sooner if concerns arise.          Follow up plan: Return in about 5 months (around 07/11/2022) for HTN, HLD, DM2 FU.

## 2022-04-14 NOTE — Progress Notes (Signed)
BP 138/78   Pulse 74   Temp 98.4 F (36.9 C) (Oral)   Wt 219 lb 11.2 oz (99.7 kg)   SpO2 98%   BMI 30.25 kg/m    Subjective:    Patient ID: Chad Matthews, male    DOB: [birth year], 61 y.o.   MRN: [id number]  HPI: Chad Matthews is a 61 y.o. male  Chief Complaint  Patient presents with   Hand Pain     R hand pain, states by the end of the day he is experiencing a lot of pain. Denies tingling.    HAND PAIN Patient states his pain worsens by the end of the day.  Feels like a hammer hits.   Duration: months Involved hand: right Mechanism of injury: unknown Location: lateral Onset: gradual Severity: moderate  Quality: aching Frequency:  worsens as the day goes on Radiation: no Aggravating factors: worsening throughout the day at he works Alleviating factors: aspercream Treatments attempted:  Relief with NSAIDs?: moderate Weakness: yes Numbness: no Redness: no Swelling:no Bruising: no Fevers: no  Relevant past medical, surgical, family and social history reviewed and updated as indicated. Interim medical history since our last visit reviewed. Allergies and medications reviewed and updated.  Review of Systems  Musculoskeletal:        Right hand pain    Per HPI unless specifically indicated above     Objective:    BP 138/78   Pulse 74   Temp 98.4 F (36.9 C) (Oral)   Wt 219 lb 11.2 oz (99.7 kg)   SpO2 98%   BMI 30.25 kg/m   Wt Readings from Last 3 Encounters:  04/15/22 219 lb 11.2 oz (99.7 kg)  02/08/22 213 lb 12.8 oz (97 kg)  12/27/21 219 lb 11.2 oz (99.7 kg)    Physical Exam Vitals and nursing note reviewed.  Constitutional:      General: He is not in acute distress.    Appearance: Normal appearance. He is not ill-appearing, toxic-appearing or diaphoretic.  HENT:     Head: Normocephalic.     Right Ear: External ear normal.     Left Ear: External ear normal.     Nose: Nose normal. No congestion or rhinorrhea.     Mouth/Throat:      Mouth: Mucous membranes are moist.  Eyes:     General:        Right eye: No discharge.        Left eye: No discharge.     Extraocular Movements: Extraocular movements intact.     Conjunctiva/sclera: Conjunctivae normal.     Pupils: Pupils are equal, round, and reactive to light.  Cardiovascular:     Rate and Rhythm: Normal rate and regular rhythm.     Heart sounds: No murmur heard. Pulmonary:     Effort: Pulmonary effort is normal. No respiratory distress.     Breath sounds: Normal breath sounds. No wheezing, rhonchi or rales.  Abdominal:     General: Abdomen is flat. Bowel sounds are normal.  Musculoskeletal:     Cervical back: Normal range of motion and neck supple.  Skin:    General: Skin is warm and dry.     Capillary Refill: Capillary refill takes less than 2 seconds.  Neurological:     General: No focal deficit present.     Mental Status: He is alert and oriented to person, place, and time.  Psychiatric:        Mood and Affect: Mood normal.  Behavior: Behavior normal.        Thought Content: Thought content normal.        Judgment: Judgment normal.     Results for orders placed or performed in visit on 12/27/21  TSH  Result Value Ref Range   TSH 1.680 0.450 - 4.500 uIU/mL  PSA  Result Value Ref Range   Prostate Specific Ag, Serum 12.8 (H) 0.0 - 4.0 ng/mL  Lipid panel  Result Value Ref Range   Cholesterol, Total 135 100 - 199 mg/dL   Triglycerides 162 (H) 0 - 149 mg/dL   HDL 41 >39 mg/dL   VLDL Cholesterol Cal 28 5 - 40 mg/dL   LDL Chol Calc (NIH) 66 0 - 99 mg/dL   Chol/HDL Ratio 3.3 0.0 - 5.0 ratio  CBC with Differential/Platelet  Result Value Ref Range   WBC 6.3 3.4 - 10.8 x10E3/uL   RBC 4.44 4.14 - 5.80 x10E6/uL   Hemoglobin 14.3 13.0 - 17.7 g/dL   Hematocrit 42.6 37.5 - 51.0 %   MCV 96 79 - 97 fL   MCH 32.2 26.6 - 33.0 pg   MCHC 33.6 31.5 - 35.7 g/dL   RDW 12.3 11.6 - 15.4 %   Platelets 89 (LL) 150 - 450 x10E3/uL   Neutrophils 69 Not Estab. %    Lymphs 20 Not Estab. %   Monocytes 8 Not Estab. %   Eos 2 Not Estab. %   Basos 1 Not Estab. %   Neutrophils Absolute 4.4 1.4 - 7.0 x10E3/uL   Lymphocytes Absolute 1.2 0.7 - 3.1 x10E3/uL   Monocytes Absolute 0.5 0.1 - 0.9 x10E3/uL   EOS (ABSOLUTE) 0.1 0.0 - 0.4 x10E3/uL   Basophils Absolute 0.0 0.0 - 0.2 x10E3/uL   Immature Granulocytes 0 Not Estab. %   Immature Grans (Abs) 0.0 0.0 - 0.1 x10E3/uL   Hematology Comments: Note:   Comprehensive metabolic panel  Result Value Ref Range   Glucose 115 (H) 70 - 99 mg/dL   BUN 14 8 - 27 mg/dL   Creatinine, Ser 0.89 0.76 - 1.27 mg/dL   eGFR 97 >59 mL/min/1.73   BUN/Creatinine Ratio 16 10 - 24   Sodium 138 134 - 144 mmol/L   Potassium 3.4 (L) 3.5 - 5.2 mmol/L   Chloride 99 96 - 106 mmol/L   CO2 23 20 - 29 mmol/L   Calcium 9.4 8.6 - 10.2 mg/dL   Total Protein 7.1 6.0 - 8.5 g/dL   Albumin 4.7 3.9 - 4.9 g/dL   Globulin, Total 2.4 1.5 - 4.5 g/dL   Albumin/Globulin Ratio 2.0 1.2 - 2.2   Bilirubin Total 1.3 (H) 0.0 - 1.2 mg/dL   Alkaline Phosphatase 116 44 - 121 IU/L   AST 29 0 - 40 IU/L   ALT 28 0 - 44 IU/L  Urinalysis, Routine w reflex microscopic  Result Value Ref Range   Specific Gravity, UA 1.015 1.005 - 1.030   pH, UA 7.0 5.0 - 7.5   Color, UA Yellow Yellow   Appearance Ur Clear Clear   Leukocytes,UA Negative Negative   Protein,UA Trace (A) Negative/Trace   Glucose, UA Negative Negative   Ketones, UA Trace (A) Negative   RBC, UA Negative Negative   Bilirubin, UA Negative Negative   Urobilinogen, Ur 0.2 0.2 - 1.0 mg/dL   Nitrite, UA Negative Negative      Assessment & Plan:   Problem List Items Addressed This Visit   None Visit Diagnoses     Right hand pain    -  Primary   Suspect arthritis. Will treat with medrol dose pak and obtain xray. Will make recommendations based on imaging results.   Relevant Medications   methylPREDNISolone (MEDROL DOSEPAK) 4 MG TBPK tablet   Other Relevant Orders   DG Hand Complete Right         Follow up plan: No follow-ups on file.

## 2022-04-15 ENCOUNTER — Encounter: Payer: Self-pay | Admitting: Nurse Practitioner

## 2022-04-15 ENCOUNTER — Ambulatory Visit: Payer: BLUE CROSS/BLUE SHIELD | Admitting: Nurse Practitioner

## 2022-04-15 VITALS — BP 138/78 | HR 74 | Temp 98.4°F | Wt 219.7 lb

## 2022-04-15 DIAGNOSIS — M79641 Pain in right hand: Secondary | ICD-10-CM | POA: Diagnosis not present

## 2022-04-15 MED ORDER — METHYLPREDNISOLONE 4 MG PO TBPK: ORAL_TABLET | ORAL | 0 refills | Status: DC

## 2022-04-18 ENCOUNTER — Ambulatory Visit
Admission: RE | Admit: 2022-04-18 | Discharge: 2022-04-18 | Disposition: A | Discharge status: Home / Self Care | Payer: BLUE CROSS/BLUE SHIELD | Source: Ambulatory Visit | Attending: Nurse Practitioner | Admitting: Nurse Practitioner

## 2022-04-18 ENCOUNTER — Ambulatory Visit
Admission: RE | Admit: 2022-04-18 | Discharge: 2022-04-18 | Disposition: A | Discharge status: Home / Self Care | Payer: BLUE CROSS/BLUE SHIELD | Source: Home / Self Care | Attending: Nurse Practitioner | Admitting: Nurse Practitioner

## 2022-04-18 DIAGNOSIS — M7989 Other specified soft tissue disorders: Secondary | ICD-10-CM | POA: Diagnosis not present

## 2022-04-18 DIAGNOSIS — M79641 Pain in right hand: Secondary | ICD-10-CM | POA: Diagnosis not present

## 2022-04-18 NOTE — Progress Notes (Signed)
Please let patient know that his xray shows that he has arthritis in his hand.  I recommend he see Orthopedics for further evaluation and management. If he needs a referral I am happy to place it.

## 2022-04-18 NOTE — Addendum Note (Signed)
Addended by: Jon Billings on: 04/18/2022 03:33 PM   Modules accepted: Orders

## 2022-04-18 NOTE — Progress Notes (Signed)
Referral placed.

## 2022-04-20 ENCOUNTER — Telehealth: Payer: Self-pay | Admitting: Nurse Practitioner

## 2022-04-20 NOTE — Telephone Encounter (Signed)
Copied from [place] [id number]. Topic: General - Other >> Apr 19, 2022  1:28 PM Chad Matthews wrote: Reason for CRM: The patient has called to request a copy of their imaging from 03/18/22  The patient would like to be contacted when/if the copy is available for them to pick up   Please contact the patient further when possible

## 2022-04-25 DIAGNOSIS — M19041 Primary osteoarthritis, right hand: Secondary | ICD-10-CM | POA: Diagnosis not present

## 2022-04-25 NOTE — Telephone Encounter (Signed)
Called and notified patient that imaging report has been printed and ready for pick up. Patient states that he actually called and got report put on a CD last week and does not need the report from our office.

## 2022-05-03 DIAGNOSIS — M180 Bilateral primary osteoarthritis of first carpometacarpal joints: Secondary | ICD-10-CM | POA: Diagnosis not present

## 2022-05-25 DIAGNOSIS — M180 Bilateral primary osteoarthritis of first carpometacarpal joints: Secondary | ICD-10-CM | POA: Diagnosis not present

## 2022-06-06 ENCOUNTER — Telehealth: Payer: Self-pay | Admitting: Nurse Practitioner

## 2022-06-06 DIAGNOSIS — E7800 Pure hypercholesterolemia, unspecified: Secondary | ICD-10-CM

## 2022-06-06 MED ORDER — ATORVASTATIN CALCIUM 20 MG PO TABS: 20.0000 mg | ORAL_TABLET | Freq: Every day | ORAL | 1 refills | Status: DC

## 2022-06-06 NOTE — Telephone Encounter (Signed)
Patient's wife came into the office and requested a refill of atorvastatin sent to [facility].

## 2022-06-29 ENCOUNTER — Other Ambulatory Visit: Payer: Self-pay | Admitting: Nurse Practitioner

## 2022-06-29 DIAGNOSIS — I10 Essential (primary) hypertension: Secondary | ICD-10-CM

## 2022-06-29 MED ORDER — AMLODIPINE BESYLATE 5 MG PO TABS: 5.0000 mg | ORAL_TABLET | Freq: Every day | ORAL | 0 refills | Status: DC

## 2022-06-29 NOTE — Telephone Encounter (Signed)
Medication Refill - Medication: amLODipine (NORVASC) 5 MG tablet   Has the patient contacted their pharmacy? Yes.   (Agent: If no, request that the patient contact the pharmacy for the refill. If patient does not wish to contact the pharmacy document the reason why and proceed with request.) (Agent: If yes, when and what did the pharmacy advise?)  Preferred Pharmacy (with phone number or street name):  [facility] [place], [place]  [address] [place] [address]  Phone: [phone] Fax: [phone]   Has the patient been seen for an appointment in the last year OR does the patient have an upcoming appointment? Yes.    Agent: Please be advised that RX refills may take up to 3 business days. We ask that you follow-up with your pharmacy.

## 2022-06-29 NOTE — Telephone Encounter (Signed)
Requested Prescriptions  Pending Prescriptions Disp Refills   amLODipine (NORVASC) 5 MG tablet 90 tablet 0    Sig: Take 1 tablet (5 mg total) by mouth daily.     Cardiovascular: Calcium Channel Blockers 2 Passed - 06/29/2022 10:45 AM      Passed - Last BP in normal range    BP Readings from Last 1 Encounters:  04/15/22 138/78         Passed - Last Heart Rate in normal range    Pulse Readings from Last 1 Encounters:  04/15/22 74         Passed - Valid encounter within last 6 months    Recent Outpatient Visits           2 months ago Right hand pain   [place], NP   4 months ago Essential hypertension   [place], NP   6 months ago Annual physical exam   [place] Jon Billings, NP   1 year ago Essential hypertension   [place] Jon Billings, NP   1 year ago Annual physical exam   [place] Jon Billings, NP       Future Appointments             In 2 weeks Jon Billings, NP [place], [place]   In 2 months Stoioff, Ronda Fairly, MD [place]

## 2022-07-11 ENCOUNTER — Ambulatory Visit: Payer: BLUE CROSS/BLUE SHIELD | Admitting: Nurse Practitioner

## 2022-07-18 ENCOUNTER — Encounter: Payer: Self-pay | Admitting: Nurse Practitioner

## 2022-07-18 ENCOUNTER — Ambulatory Visit: Payer: 59 | Admitting: Nurse Practitioner

## 2022-07-18 VITALS — BP 146/73 | HR 70 | Temp 98.4°F | Wt 220.9 lb

## 2022-07-18 DIAGNOSIS — I10 Essential (primary) hypertension: Secondary | ICD-10-CM | POA: Diagnosis not present

## 2022-07-18 DIAGNOSIS — R7303 Prediabetes: Secondary | ICD-10-CM

## 2022-07-18 DIAGNOSIS — E7800 Pure hypercholesterolemia, unspecified: Secondary | ICD-10-CM

## 2022-07-18 DIAGNOSIS — N401 Enlarged prostate with lower urinary tract symptoms: Secondary | ICD-10-CM | POA: Diagnosis not present

## 2022-07-18 MED ORDER — TAMSULOSIN HCL 0.4 MG PO CAPS: 0.4000 mg | ORAL_CAPSULE | Freq: Every day | ORAL | 1 refills | Status: DC

## 2022-07-18 MED ORDER — TRIAMTERENE-HCTZ 37.5-25 MG PO TABS: 1.0000 | ORAL_TABLET | Freq: Every day | ORAL | 1 refills | Status: DC

## 2022-07-18 NOTE — Assessment & Plan Note (Signed)
Labs ordered at visit today.  Will make recommendations based on lab results.   

## 2022-07-18 NOTE — Assessment & Plan Note (Signed)
Chronic.  Controlled.  Continue with current medication regimen of Atorvastatin daily.  Labs ordered today.  Return to clinic in 6 months for reevaluation.  Call sooner if concerns arise.

## 2022-07-18 NOTE — Assessment & Plan Note (Signed)
Chronic.  Not well controlled.   Patient is stressed this morning due to going to the wrong office.  Also taking Meloxicam due to hand pain.  Recommend stopping Meloxicam.  Checking blood pressures at home and bringing log to next visit.  Labs ordered today.  Follow up in 1 month.

## 2022-07-18 NOTE — Progress Notes (Signed)
BP (!) 146/73 (BP Location: Left Arm, Cuff Size: Normal)   Pulse 70   Temp 98.4 F (36.9 C) (Oral)   Wt 220 lb 14.4 oz (100.2 kg)   SpO2 98%   BMI 30.42 kg/m    Subjective:    Patient ID: Chad Matthews, male    DOB: [id number], 62 y.o.   MRN: [id number]  HPI: Chad Matthews is a 62 y.o. male  Chief Complaint  Patient presents with   Hyperlipidemia   Hypertension   Prediabetes   HYPERTENSION / Lane Satisfied with current treatment? yes Duration of hypertension: years BP monitoring frequency: not checking BP range:  BP medication side effects: no Past BP meds:  Triamterene, amlodipine, and HCTZ Duration of hyperlipidemia: years Cholesterol medication side effects: no Cholesterol supplements: none Past cholesterol medications: atorvastain (lipitor) Medication compliance: excellent compliance Aspirin: no Recent stressors: no Recurrent headaches: no Visual changes: no Palpitations: no Dyspnea: no Chest pain: no Lower extremity edema: no Dizzy/lightheaded: no  Relevant past medical, surgical, family and social history reviewed and updated as indicated. Interim medical history since our last visit reviewed. Allergies and medications reviewed and updated.  Review of Systems  Eyes:  Negative for visual disturbance.  Respiratory:  Negative for chest tightness and shortness of breath.   Cardiovascular:  Negative for chest pain, palpitations and leg swelling.  Neurological:  Negative for dizziness, light-headedness and headaches.    Per HPI unless specifically indicated above     Objective:    BP (!) 146/73 (BP Location: Left Arm, Cuff Size: Normal)   Pulse 70   Temp 98.4 F (36.9 C) (Oral)   Wt 220 lb 14.4 oz (100.2 kg)   SpO2 98%   BMI 30.42 kg/m   Wt Readings from Last 3 Encounters:  07/18/22 220 lb 14.4 oz (100.2 kg)  04/15/22 219 lb 11.2 oz (99.7 kg)  02/08/22 213 lb 12.8 oz (97 kg)    Physical Exam Vitals and nursing note  reviewed.  Constitutional:      General: He is not in acute distress.    Appearance: Normal appearance. He is not ill-appearing, toxic-appearing or diaphoretic.  HENT:     Head: Normocephalic.     Right Ear: External ear normal.     Left Ear: External ear normal.     Nose: Nose normal. No congestion or rhinorrhea.     Mouth/Throat:     Mouth: Mucous membranes are moist.  Eyes:     General:        Right eye: No discharge.        Left eye: No discharge.     Extraocular Movements: Extraocular movements intact.     Conjunctiva/sclera: Conjunctivae normal.     Pupils: Pupils are equal, round, and reactive to light.  Cardiovascular:     Rate and Rhythm: Normal rate and regular rhythm.     Heart sounds: No murmur heard. Pulmonary:     Effort: Pulmonary effort is normal. No respiratory distress.     Breath sounds: Normal breath sounds. No wheezing, rhonchi or rales.  Abdominal:     General: Abdomen is flat. Bowel sounds are normal.  Musculoskeletal:     Cervical back: Normal range of motion and neck supple.  Skin:    General: Skin is warm and dry.     Capillary Refill: Capillary refill takes less than 2 seconds.  Neurological:     General: No focal deficit present.     Mental Status: He is  alert and oriented to person, place, and time.  Psychiatric:        Mood and Affect: Mood normal.        Behavior: Behavior normal.        Thought Content: Thought content normal.        Judgment: Judgment normal.     Results for orders placed or performed in visit on 12/27/21  TSH  Result Value Ref Range   TSH 1.680 0.450 - 4.500 uIU/mL  PSA  Result Value Ref Range   Prostate Specific Ag, Serum 12.8 (H) 0.0 - 4.0 ng/mL  Lipid panel  Result Value Ref Range   Cholesterol, Total 135 100 - 199 mg/dL   Triglycerides 162 (H) 0 - 149 mg/dL   HDL 41 >39 mg/dL   VLDL Cholesterol Cal 28 5 - 40 mg/dL   LDL Chol Calc (NIH) 66 0 - 99 mg/dL   Chol/HDL Ratio 3.3 0.0 - 5.0 ratio  CBC with  Differential/Platelet  Result Value Ref Range   WBC 6.3 3.4 - 10.8 x10E3/uL   RBC 4.44 4.14 - 5.80 x10E6/uL   Hemoglobin 14.3 13.0 - 17.7 g/dL   Hematocrit 42.6 37.5 - 51.0 %   MCV 96 79 - 97 fL   MCH 32.2 26.6 - 33.0 pg   MCHC 33.6 31.5 - 35.7 g/dL   RDW 12.3 11.6 - 15.4 %   Platelets 89 (LL) 150 - 450 x10E3/uL   Neutrophils 69 Not Estab. %   Lymphs 20 Not Estab. %   Monocytes 8 Not Estab. %   Eos 2 Not Estab. %   Basos 1 Not Estab. %   Neutrophils Absolute 4.4 1.4 - 7.0 x10E3/uL   Lymphocytes Absolute 1.2 0.7 - 3.1 x10E3/uL   Monocytes Absolute 0.5 0.1 - 0.9 x10E3/uL   EOS (ABSOLUTE) 0.1 0.0 - 0.4 x10E3/uL   Basophils Absolute 0.0 0.0 - 0.2 x10E3/uL   Immature Granulocytes 0 Not Estab. %   Immature Grans (Abs) 0.0 0.0 - 0.1 x10E3/uL   Hematology Comments: Note:   Comprehensive metabolic panel  Result Value Ref Range   Glucose 115 (H) 70 - 99 mg/dL   BUN 14 8 - 27 mg/dL   Creatinine, Ser 0.89 0.76 - 1.27 mg/dL   eGFR 97 >59 mL/min/1.73   BUN/Creatinine Ratio 16 10 - 24   Sodium 138 134 - 144 mmol/L   Potassium 3.4 (L) 3.5 - 5.2 mmol/L   Chloride 99 96 - 106 mmol/L   CO2 23 20 - 29 mmol/L   Calcium 9.4 8.6 - 10.2 mg/dL   Total Protein 7.1 6.0 - 8.5 g/dL   Albumin 4.7 3.9 - 4.9 g/dL   Globulin, Total 2.4 1.5 - 4.5 g/dL   Albumin/Globulin Ratio 2.0 1.2 - 2.2   Bilirubin Total 1.3 (H) 0.0 - 1.2 mg/dL   Alkaline Phosphatase 116 44 - 121 IU/L   AST 29 0 - 40 IU/L   ALT 28 0 - 44 IU/L  Urinalysis, Routine w reflex microscopic  Result Value Ref Range   Specific Gravity, UA 1.015 1.005 - 1.030   pH, UA 7.0 5.0 - 7.5   Color, UA Yellow Yellow   Appearance Ur Clear Clear   Leukocytes,UA Negative Negative   Protein,UA Trace (A) Negative/Trace   Glucose, UA Negative Negative   Ketones, UA Trace (A) Negative   RBC, UA Negative Negative   Bilirubin, UA Negative Negative   Urobilinogen, Ur 0.2 0.2 - 1.0 mg/dL   Nitrite, UA Negative  Negative      Assessment & Plan:    Problem List Items Addressed This Visit       Cardiovascular and Mediastinum   Essential hypertension - Primary    Chronic.  Not well controlled.   Patient is stressed this morning due to going to the wrong office.  Also taking Meloxicam due to hand pain.  Recommend stopping Meloxicam.  Checking blood pressures at home and bringing log to next visit.  Labs ordered today.  Follow up in 1 month.       Relevant Medications   triamterene-hydrochlorothiazide (MAXZIDE-25) 37.5-25 MG tablet   Other Relevant Orders   Comp Met (CMET)     Other   Hypercholesteremia    Chronic.  Controlled.  Continue with current medication regimen of Atorvastatin daily.  Labs ordered today.  Return to clinic in 6 months for reevaluation.  Call sooner if concerns arise.        Relevant Medications   triamterene-hydrochlorothiazide (MAXZIDE-25) 37.5-25 MG tablet   Other Relevant Orders   Lipid Profile   Prediabetes    Labs ordered at visit today.  Will make recommendations based on lab results.        Relevant Orders   HgB A1c   Other Visit Diagnoses     Benign prostatic hyperplasia with lower urinary tract symptoms, symptom details unspecified       Relevant Medications   tamsulosin (FLOMAX) 0.4 MG CAPS capsule        Follow up plan: Return in about 1 month (around 08/16/2022) for BP Check.

## 2022-07-19 LAB — COMPREHENSIVE METABOLIC PANEL
ALT: 24 IU/L (ref 0–44)
AST: 30 IU/L (ref 0–40)
Albumin/Globulin Ratio: 1.9 (ref 1.2–2.2)
Albumin: 4.8 g/dL (ref 3.9–4.9)
Alkaline Phosphatase: 108 IU/L (ref 44–121)
BUN/Creatinine Ratio: 18 (ref 10–24)
BUN: 14 mg/dL (ref 8–27)
Bilirubin Total: 0.6 mg/dL (ref 0.0–1.2)
CO2: 21 mmol/L (ref 20–29)
Calcium: 9.4 mg/dL (ref 8.6–10.2)
Chloride: 102 mmol/L (ref 96–106)
Creatinine, Ser: 0.8 mg/dL (ref 0.76–1.27)
Globulin, Total: 2.5 g/dL (ref 1.5–4.5)
Glucose: 122 mg/dL — ABNORMAL HIGH (ref 70–99)
Potassium: 4.1 mmol/L (ref 3.5–5.2)
Sodium: 142 mmol/L (ref 134–144)
Total Protein: 7.3 g/dL (ref 6.0–8.5)
eGFR: 99 mL/min/{1.73_m2} (ref >59)

## 2022-07-19 LAB — LIPID PANEL
Chol/HDL Ratio: 3.5 ratio (ref 0.0–5.0)
Cholesterol, Total: 139 mg/dL (ref 100–199)
HDL: 40 mg/dL (ref >39)
LDL Chol Calc (NIH): 72 mg/dL (ref 0–99)
Triglycerides: 156 mg/dL — ABNORMAL HIGH (ref 0–149)
VLDL Cholesterol Cal: 27 mg/dL (ref 5–40)

## 2022-07-19 LAB — HEMOGLOBIN A1C
Est. average glucose Bld gHb Est-mCnc: 128 mg/dL
Hgb A1c MFr Bld: 6.1 % — ABNORMAL HIGH (ref 4.8–5.6)

## 2022-07-19 NOTE — Progress Notes (Signed)
Hi Chad Matthews. It was nice to see you yesterday.  Your lab work looks good.  Your A1c is well controlled 6.1%.  No concerns at this time. Continue with your current medication regimen.  Follow up as discussed.  Please let me know if you have any questions.

## 2022-07-29 ENCOUNTER — Other Ambulatory Visit: Payer: Self-pay | Admitting: Nurse Practitioner

## 2022-07-29 DIAGNOSIS — I10 Essential (primary) hypertension: Secondary | ICD-10-CM

## 2022-07-29 MED ORDER — TRIAMTERENE-HCTZ 37.5-25 MG PO TABS: 1.0000 | ORAL_TABLET | Freq: Every day | ORAL | 1 refills | Status: DC

## 2022-07-29 NOTE — Telephone Encounter (Signed)
Change in pharmacy- Rx forwarded to requested pharmacy Requested Prescriptions  Pending Prescriptions Disp Refills   triamterene-hydrochlorothiazide (MAXZIDE-25) 37.5-25 MG tablet 90 tablet 1    Sig: Take 1 tablet by mouth daily.     Cardiovascular: Diuretic Combos Failed - 07/29/2022  2:22 PM      Failed - Last BP in normal range    BP Readings from Last 1 Encounters:  07/18/22 (!) 146/73         Passed - K in normal range and within 180 days    Potassium  Date Value Ref Range Status  07/18/2022 4.1 3.5 - 5.2 mmol/L Final         Passed - Na in normal range and within 180 days    Sodium  Date Value Ref Range Status  07/18/2022 142 134 - 144 mmol/L Final         Passed - Cr in normal range and within 180 days    Creatinine, Ser  Date Value Ref Range Status  07/18/2022 0.80 0.76 - 1.27 mg/dL Final         Passed - Valid encounter within last 6 months    Recent Outpatient Visits           1 week ago Essential hypertension   [place], NP   3 months ago Right hand pain   [place], NP   5 months ago Essential hypertension   [place], NP   7 months ago Annual physical exam   [place] Jon Billings, NP   1 year ago Essential hypertension   [place] Jon Billings, NP       Future Appointments             In 2 weeks Jon Billings, NP [place], [place]   In 1 month Stoioff, Ronda Fairly, MD [place]

## 2022-07-29 NOTE — Telephone Encounter (Signed)
Medication Refill - Medication: triamterene-hydrochlorothiazide (MAXZIDE-25) 37.5-25 MG tablet    Has the patient contacted their pharmacy? Yes.   The patient's wife stated the insurance changed, and the new preferred pharmacy is below.Requesting Rx be transferred.   Please advise.   (Agent: If yes, when and what did the pharmacy advise?)  Preferred Pharmacy (with phone number or street name):  [facility] ([place], [facility]  [address] [place] [address] [place] [address]  Phone: [phone] Fax: [phone]  Hours: Not open 24 hours   Has the patient been seen for an appointment in the last year OR does the patient have an upcoming appointment? Yes.    Agent: Please be advised that RX refills may take up to 3 business days. We ask that you follow-up with your pharmacy.

## 2022-08-09 ENCOUNTER — Other Ambulatory Visit: Payer: Self-pay | Admitting: Nurse Practitioner

## 2022-08-09 DIAGNOSIS — N401 Enlarged prostate with lower urinary tract symptoms: Secondary | ICD-10-CM

## 2022-08-09 NOTE — Telephone Encounter (Signed)
Copied from [place] [id number]. Topic: General - Other >> Aug 09, 2022 10:00 AM Everette C wrote: Reason for CRM: Medication Refill - Medication: tamsulosin (FLOMAX) 0.4 MG CAPS capsule [id number]  Has the patient contacted their pharmacy? Yes.   (Agent: If no, request that the patient contact the pharmacy for the refill. If patient does not wish to contact the pharmacy document the reason why and proceed with request.) (Agent: If yes, when and what did the pharmacy advise?)  Preferred Pharmacy (with phone number or street name): [facility] ([place], [place] [facility] [address] [place] [address] [place] [address] Phone: [phone] Fax: [phone] Hours: Not open 24 hours   Has the patient been seen for an appointment in the last year OR does the patient have an upcoming appointment? Yes.    Agent: Please be advised that RX refills may take up to 3 business days. We ask that you follow-up with your pharmacy.

## 2022-08-09 NOTE — Telephone Encounter (Signed)
Unable to refill per protocol, Rx request is too soon. Last refill 07/18/22 for 90 and 1 refill.  Requested Prescriptions  Pending Prescriptions Disp Refills   tamsulosin (FLOMAX) 0.4 MG CAPS capsule 90 capsule 1    Sig: Take 1 capsule (0.4 mg total) by mouth daily.     Urology: Alpha-Adrenergic Blocker Failed - 08/09/2022 10:19 AM      Failed - PSA in normal range and within 360 days    Prostate Specific Ag, Serum  Date Value Ref Range Status  12/27/2021 12.8 (H) 0.0 - 4.0 ng/mL Final    Comment:    Roche ECLIA methodology. According to the American Urological Association, Serum PSA should decrease and remain at undetectable levels after radical prostatectomy. The AUA defines biochemical recurrence as an initial PSA value 0.2 ng/mL or greater followed by a subsequent confirmatory PSA value 0.2 ng/mL or greater. Values obtained with different assay methods or kits cannot be used interchangeably. Results cannot be interpreted as absolute evidence of the presence or absence of malignant disease.          Failed - Last BP in normal range    BP Readings from Last 1 Encounters:  07/18/22 (!) 146/73         Passed - Valid encounter within last 12 months    Recent Outpatient Visits           3 weeks ago Essential hypertension   [place], NP   3 months ago Right hand pain   [place], NP   6 months ago Essential hypertension   [place], NP   7 months ago Annual physical exam   [place] Jon Billings, NP   1 year ago Essential hypertension   [place] Jon Billings, NP       Future Appointments             In 1 week Jon Billings, NP [place], [place]   In 1 month Stoioff, Ronda Fairly, MD [place]

## 2022-08-17 NOTE — Progress Notes (Unsigned)
   There were no vitals taken for this visit.   Subjective:    Patient ID: Chad Matthews, male    DOB: [id number], 62 y.o.   MRN: [id number]  HPI: Chad Matthews is a 63 y.o. male  No chief complaint on file.  HYPERTENSION / HYPERLIPIDEMIA Satisfied with current treatment? yes Duration of hypertension: years BP monitoring frequency: not checking BP range:  BP medication side effects: no Past BP meds:  Triamterene, amlodipine, and HCTZ Duration of hyperlipidemia: years Cholesterol medication side effects: no Cholesterol supplements: none Past cholesterol medications: atorvastain (lipitor) Medication compliance: excellent compliance Aspirin: no Recent stressors: no Recurrent headaches: no Visual changes: no Palpitations: no Dyspnea: no Chest pain: no Lower extremity edema: no Dizzy/lightheaded: no Relevant past medical, surgical, family and social history reviewed and updated as indicated. Interim medical history since our last visit reviewed. Allergies and medications reviewed and updated.  Review of Systems  Per HPI unless specifically indicated above     Objective:    There were no vitals taken for this visit.  Wt Readings from Last 3 Encounters:  07/18/22 220 lb 14.4 oz (100.2 kg)  04/15/22 219 lb 11.2 oz (99.7 kg)  02/08/22 213 lb 12.8 oz (97 kg)    Physical Exam  Results for orders placed or performed in visit on 07/18/22  Comp Met (CMET)  Result Value Ref Range   Glucose 122 (H) 70 - 99 mg/dL   BUN 14 8 - 27 mg/dL   Creatinine, Ser 0.80 0.76 - 1.27 mg/dL   eGFR 99 >59 mL/min/1.73   BUN/Creatinine Ratio 18 10 - 24   Sodium 142 134 - 144 mmol/L   Potassium 4.1 3.5 - 5.2 mmol/L   Chloride 102 96 - 106 mmol/L   CO2 21 20 - 29 mmol/L   Calcium 9.4 8.6 - 10.2 mg/dL   Total Protein 7.3 6.0 - 8.5 g/dL   Albumin 4.8 3.9 - 4.9 g/dL   Globulin, Total 2.5 1.5 - 4.5 g/dL   Albumin/Globulin Ratio 1.9 1.2 - 2.2   Bilirubin Total 0.6 0.0 - 1.2 mg/dL    Alkaline Phosphatase 108 44 - 121 IU/L   AST 30 0 - 40 IU/L   ALT 24 0 - 44 IU/L  Lipid Profile  Result Value Ref Range   Cholesterol, Total 139 100 - 199 mg/dL   Triglycerides 156 (H) 0 - 149 mg/dL   HDL 40 >39 mg/dL   VLDL Cholesterol Cal 27 5 - 40 mg/dL   LDL Chol Calc (NIH) 72 0 - 99 mg/dL   Chol/HDL Ratio 3.5 0.0 - 5.0 ratio  HgB A1c  Result Value Ref Range   Hgb A1c MFr Bld 6.1 (H) 4.8 - 5.6 %   Est. average glucose Bld gHb Est-mCnc 128 mg/dL      Assessment & Plan:   Problem List Items Addressed This Visit       Cardiovascular and Mediastinum   Essential hypertension - Primary     Follow up plan: No follow-ups on file.

## 2022-08-18 ENCOUNTER — Encounter: Payer: Self-pay | Admitting: Nurse Practitioner

## 2022-08-18 ENCOUNTER — Ambulatory Visit: Payer: 59 | Admitting: Nurse Practitioner

## 2022-08-18 VITALS — BP 147/73 | HR 71 | Temp 98.7°F | Wt 217.6 lb

## 2022-08-18 DIAGNOSIS — I10 Essential (primary) hypertension: Secondary | ICD-10-CM | POA: Diagnosis not present

## 2022-08-18 DIAGNOSIS — N401 Enlarged prostate with lower urinary tract symptoms: Secondary | ICD-10-CM | POA: Diagnosis not present

## 2022-08-18 MED ORDER — TAMSULOSIN HCL 0.4 MG PO CAPS: 0.4000 mg | ORAL_CAPSULE | Freq: Every day | ORAL | 1 refills | Status: DC

## 2022-08-18 MED ORDER — AMLODIPINE BESYLATE 10 MG PO TABS: 10.0000 mg | ORAL_TABLET | Freq: Every day | ORAL | 1 refills | Status: DC

## 2022-08-18 NOTE — Assessment & Plan Note (Signed)
Chronic. Not well controlled.  Stopped the Meloxicam but blood pressure is still elevated.  Will increase Amlodipine to 10mg .  Follow up in 1 month.  Call sooner if concerns arise.

## 2022-09-08 ENCOUNTER — Other Ambulatory Visit: Payer: Self-pay | Admitting: *Deleted

## 2022-09-08 DIAGNOSIS — N401 Enlarged prostate with lower urinary tract symptoms: Secondary | ICD-10-CM

## 2022-09-09 ENCOUNTER — Other Ambulatory Visit: Payer: 59

## 2022-09-09 DIAGNOSIS — N401 Enlarged prostate with lower urinary tract symptoms: Secondary | ICD-10-CM

## 2022-09-10 LAB — PSA: Prostate Specific Ag, Serum: 12.7 ng/mL — ABNORMAL HIGH (ref 0.0–4.0)

## 2022-09-12 ENCOUNTER — Ambulatory Visit: Payer: 59 | Admitting: Urology

## 2022-09-12 ENCOUNTER — Encounter: Payer: Self-pay | Admitting: Urology

## 2022-09-12 VITALS — BP 143/71 | HR 73 | Ht 71.46 in | Wt 218.0 lb

## 2022-09-12 DIAGNOSIS — R972 Elevated prostate specific antigen [PSA]: Secondary | ICD-10-CM

## 2022-09-12 DIAGNOSIS — N401 Enlarged prostate with lower urinary tract symptoms: Secondary | ICD-10-CM

## 2022-09-12 NOTE — Progress Notes (Signed)
I, DeAsia L Maxie,acting as a scribe for Riki Altes, MD.,have documented all relevant documentation on the behalf of Riki Altes, MD,as directed by  Riki Altes, MD while in the presence of Riki Altes, MD.   09/12/22 9:34 AM   Chad Matthews Oct 10, 1960 [id number]  Referring provider: Larae Grooms, NP [address] [place],  [place] [address]  Chief Complaint  Patient presents with   Elevated PSA    Urologic history: 1.  Elevated PSA Prostate biopsy 04/2017; PSA 7.7; prostate volume 79 g; benign pathology PSA June 2019 9.7; prostate MRI no suspicious lesions; volume 105 g PSA bump 14.2 01/2020; repeat biopsy 02/2020 benign; 115 g prostate  2. BPH with LUTS On Tamsulosin  HPI: 62 y.o. male presents for annual follow-up for elevated PSA.  He has experienced a slight increase in urgency since his last visit. Remains on Tamsulosin Denies dysuria, gross hematuria Denies flank, abdominal or pelvic pain  PSA on 09/09/2022 slightly lower at 12.7.  PSA trend  Prostate Specific Ag, Serum  Latest Ref Rng 0.0 - 4.0 ng/mL  11/08/2017 9.7 (H)   06/05/2018 8.9 (H)   12/04/2018 9.4 (H)   06/27/2019 10.2 (H)   12/23/2019 13.7 (H)   02/04/2020 14.2 (H)   08/27/2020 11.3 (H)   12/23/2020 13.4 (H)   12/31/2020 13.5 (H)   09/02/2021 14.4 (H)   12/27/2021 12.8 (H)   09/09/2022 12.7 (H)      PMH: Past Medical History:  Diagnosis Date   Arthritis    Cataract    GERD (gastroesophageal reflux disease)    Hernia, hiatal    Hypertension     Surgical History: Past Surgical History:  Procedure Laterality Date   bicep surgery Right    CATARACT EXTRACTION  08/2020   COLONOSCOPY WITH PROPOFOL N/A 08/27/2015   Procedure: COLONOSCOPY WITH PROPOFOL;  Surgeon: Scot Jun, MD;  Location[facility] END[facility]COPY;  Service: Endoscopy;  Laterality: N/A;   ELBOW SURGERY Left 1979   HERNIA REPAIR  1969   TOTAL HIP ARTHROPLASTY Right 08/08/2014   Procedure: RIGHT TOTAL HIP ARTHROPLASTY;   Surgeon: Frederico Hamman, MD;  Location: [facility] OR;  Service: Orthopedics;  Laterality: Right;    Home Medications:  Allergies as of 09/12/2022       Reactions   Bee Venom Anaphylaxis   Lisinopril Cough        Medication List        Accurate as of September 12, 2022  9:34 AM. If you have any questions, ask your nurse or doctor.          amLODipine 10 MG tablet Commonly known as: NORVASC Take 1 tablet (10 mg total) by mouth daily.   atorvastatin 20 MG tablet Commonly known as: LIPITOR Take 1 tablet (20 mg total) by mouth daily.   CENTRUM SILVER 50+MEN PO Take by mouth daily.   famotidine 20 MG tablet Commonly known as: PEPCID Take 20 mg by mouth at bedtime.   tamsulosin 0.4 MG Caps capsule Commonly known as: FLOMAX Take 1 capsule (0.4 mg total) by mouth daily.   triamterene-hydrochlorothiazide 37.5-25 MG tablet Commonly known as: MAXZIDE-25 Take 1 tablet by mouth daily.        Allergies:  Allergies  Allergen Reactions   Bee Venom Anaphylaxis   Lisinopril Cough    Family History: Family History  Problem Relation Age of Onset   Healthy Mother    Cancer Father    Stroke Father  Transient ischemic attack Father    Lung cancer Father    Healthy Brother    Healthy Brother    Alzheimer's disease Maternal Grandmother    Osteoporosis Paternal Grandmother    Bladder Cancer Neg Hx    Kidney cancer Neg Hx    Prostate cancer Neg Hx     Social History:  reports that he quit smoking about 38 years ago. His smoking use included cigarettes. He has a 5.00 pack-year smoking history. He has never used smokeless tobacco. He reports current alcohol use of about 14.0 standard drinks of alcohol per week. He reports that he does not use drugs.   Physical Exam: BP (!) 143/71   Pulse 73   Ht 5' 11.46" (1.815 m)   Wt 218 lb (98.9 kg)   BMI 30.01 kg/m   Constitutional:  Alert and oriented, No acute distress. HEENT: [place] AT Respiratory: Normal respiratory effort, no  increased work of breathing. GU: Prostate 80+ cc, smooth without nodules.   Skin: No rashes, bruises or suspicious lesions. Neurologic: Grossly intact, no focal deficits, moving all 4 extremities. Psychiatric: Normal mood and affect.  Assessment & Plan:    1.  Elevated PSA Stable Benign DRE  2.  BPH with LUTS Slight increase in urgency, however, his symptoms are not bothersome enough that he desires additional management. We discussed titrating Tamsulosin to 0.8 mg or starting a beta-3 agonist. Lab visit for PSA in 6 months; one year follow up PSA/DRE.   I, Phyllis Ginger Abdulla,acting as a scribe for Riki Altes, MD.,have documented all relevant documentation on the behalf of Riki Altes, MD,as directed by  Riki Altes, MD while in the presence of Riki Altes, MD.   I have reviewed the above documentation for accuracy and completeness, and I agree with the above.   Riki Altes, MD  [facility] [address], [address] [place], [place] [address] [phone]

## 2022-09-21 ENCOUNTER — Ambulatory Visit: Payer: 59 | Admitting: Nurse Practitioner

## 2022-09-21 ENCOUNTER — Encounter: Payer: Self-pay | Admitting: Nurse Practitioner

## 2022-09-21 VITALS — BP 138/66 | HR 71 | Temp 98.3°F | Wt 216.9 lb

## 2022-09-21 DIAGNOSIS — N401 Enlarged prostate with lower urinary tract symptoms: Secondary | ICD-10-CM

## 2022-09-21 DIAGNOSIS — E7800 Pure hypercholesterolemia, unspecified: Secondary | ICD-10-CM

## 2022-09-21 DIAGNOSIS — I10 Essential (primary) hypertension: Secondary | ICD-10-CM

## 2022-09-21 MED ORDER — TRIAMTERENE-HCTZ 37.5-25 MG PO TABS
1.0000 | ORAL_TABLET | Freq: Every day | ORAL | 1 refills | Status: AC
Start: 2022-09-21 — End: ?

## 2022-09-21 MED ORDER — AMLODIPINE BESYLATE 10 MG PO TABS: 10.0000 mg | ORAL_TABLET | Freq: Every day | ORAL | 1 refills | Status: DC

## 2022-09-21 MED ORDER — TAMSULOSIN HCL 0.4 MG PO CAPS
0.4000 mg | ORAL_CAPSULE | Freq: Every day | ORAL | 1 refills | Status: AC
Start: 2022-09-21 — End: ?

## 2022-09-21 MED ORDER — ATORVASTATIN CALCIUM 20 MG PO TABS
20.0000 mg | ORAL_TABLET | Freq: Every day | ORAL | 1 refills | Status: AC
Start: 2022-09-21 — End: ?

## 2022-09-21 NOTE — Progress Notes (Signed)
BP 138/66   Pulse 71   Temp 98.3 F (36.8 C) (Oral)   Wt 216 lb 14.4 oz (98.4 kg)   SpO2 98%   BMI 29.86 kg/m    Subjective:    Patient ID: Chad Matthews, male    DOB: 03/12/1961, 62 y.o.   MRN: [id number]  HPI: Chad Matthews is a 62 y.o. male  Chief Complaint  Patient presents with   Hypertension   HYPERTENSION without Chronic Kidney Disease Hypertension status: controlled  Satisfied with current treatment? yes Duration of hypertension: years BP monitoring frequency:  not checking BP range:  BP medication side effects:  no Medication compliance: excellent compliance Previous BP meds: Maxide and amlodipine Aspirin: no Recurrent headaches: no Visual changes: no Palpitations: no Dyspnea: no Chest pain: no Lower extremity edema: no Dizzy/lightheaded: no  Relevant past medical, surgical, family and social history reviewed and updated as indicated. Interim medical history since our last visit reviewed. Allergies and medications reviewed and updated.  Review of Systems  Eyes:  Negative for visual disturbance.  Respiratory:  Negative for shortness of breath.   Cardiovascular:  Negative for chest pain and leg swelling.  Neurological:  Negative for light-headedness and headaches.    Per HPI unless specifically indicated above     Objective:    BP 138/66   Pulse 71   Temp 98.3 F (36.8 C) (Oral)   Wt 216 lb 14.4 oz (98.4 kg)   SpO2 98%   BMI 29.86 kg/m   Wt Readings from Last 3 Encounters:  09/21/22 216 lb 14.4 oz (98.4 kg)  09/12/22 218 lb (98.9 kg)  08/18/22 217 lb 9.6 oz (98.7 kg)    Physical Exam Vitals and nursing note reviewed.  Constitutional:      General: He is not in acute distress.    Appearance: Normal appearance. He is not ill-appearing, toxic-appearing or diaphoretic.  HENT:     Head: Normocephalic.     Right Ear: External ear normal.     Left Ear: External ear normal.     Nose: Nose normal. No congestion or rhinorrhea.      Mouth/Throat:     Mouth: Mucous membranes are moist.  Eyes:     General:        Right eye: No discharge.        Left eye: No discharge.     Extraocular Movements: Extraocular movements intact.     Conjunctiva/sclera: Conjunctivae normal.     Pupils: Pupils are equal, round, and reactive to light.  Cardiovascular:     Rate and Rhythm: Normal rate and regular rhythm.     Heart sounds: No murmur heard. Pulmonary:     Effort: Pulmonary effort is normal. No respiratory distress.     Breath sounds: Normal breath sounds. No wheezing, rhonchi or rales.  Abdominal:     General: Abdomen is flat. Bowel sounds are normal.  Musculoskeletal:     Cervical back: Normal range of motion and neck supple.  Skin:    General: Skin is warm and dry.     Capillary Refill: Capillary refill takes less than 2 seconds.  Neurological:     General: No focal deficit present.     Mental Status: He is alert and oriented to person, place, and time.  Psychiatric:        Mood and Affect: Mood normal.        Behavior: Behavior normal.        Thought Content: Thought content  normal.        Judgment: Judgment normal.     Results for orders placed or performed in visit on 09/09/22  PSA  Result Value Ref Range   Prostate Specific Ag, Serum 12.7 (H) 0.0 - 4.0 ng/mL      Assessment & Plan:   Problem List Items Addressed This Visit       Cardiovascular and Mediastinum   Essential hypertension - Primary    Chronic.  Controlled.  Continue with current medication regimen of Amlodipine and Maxide.  Return to clinic in 6 months for reevaluation.  Call sooner if concerns arise.        Relevant Medications   amLODipine (NORVASC) 10 MG tablet   atorvastatin (LIPITOR) 20 MG tablet   triamterene-hydrochlorothiazide (MAXZIDE-25) 37.5-25 MG tablet   Other Visit Diagnoses     Hypercholesterolemia       Relevant Medications   amLODipine (NORVASC) 10 MG tablet   atorvastatin (LIPITOR) 20 MG tablet    triamterene-hydrochlorothiazide (MAXZIDE-25) 37.5-25 MG tablet   Benign prostatic hyperplasia with lower urinary tract symptoms, symptom details unspecified       Relevant Medications   tamsulosin (FLOMAX) 0.4 MG CAPS capsule        Follow up plan: Return in about 6 months (around 03/23/2023) for Physical and Fasting labs.

## 2022-09-21 NOTE — Assessment & Plan Note (Signed)
Chronic.  Controlled.  Continue with current medication regimen of Amlodipine and Maxide.  Return to clinic in 6 months for reevaluation.  Call sooner if concerns arise.

## 2023-02-23 ENCOUNTER — Ambulatory Visit: Payer: Self-pay

## 2023-02-23 NOTE — Telephone Encounter (Signed)
Summary: leg pain   Pt states his right leg has been hurting for about 3 days. He said in the am it hurts a little, but by end of day it goes all the way down his leg.  Same leg he had hip replacement 8 yrs ago. Pt was to know does he need to come to see his pcp first, or go to emergency ortho.  He says he has got to do something quickly, b/c it's not getting better.     Chief Complaint: Right leg pain. Worse after walking and working all day. Pain 7/10 Symptoms: Above, prior hip replacement. Frequency: 3 days ago Pertinent Negatives: Patient denies weakness Disposition: [] ED /[x] Urgent Care (no appt availability in office) / [] Appointment(In office/virtual)/ []  [facility]/ [] Home Care/ [] Refused Recommended Disposition /[] [facility]/ []  Follow-up with PCP Additional Notes: No availability, going to [facility].  Reason for Disposition  [1] SEVERE pain (e.g., excruciating, unable to do any normal activities) AND [2] not improved after 2 hours of pain medicine  Answer Assessment - Initial Assessment Questions 1. ONSET: "When did the pain start?"      3 days ago 2. LOCATION: "Where is the pain located?"      Right leg 3. PAIN: "How bad is the pain?"    (Scale 1-10; or mild, moderate, severe)   -  MILD (1-3): doesn't interfere with normal activities    -  MODERATE (4-7): interferes with normal activities (e.g., work or school) or awakens from sleep, limping    -  SEVERE (8-10): excruciating pain, unable to do any normal activities, unable to walk     Now - 3  with walking 7 4. WORK OR EXERCISE: "Has there been any recent work or exercise that involved this part of the body?"      No 5. CAUSE: "What do you think is causing the leg pain?"     Unsure 6. OTHER SYMPTOMS: "Do you have any other symptoms?" (e.g., chest pain, back pain, breathing difficulty, swelling, rash, fever, numbness, weakness)     No 7. PREGNANCY: "Is there any chance you are pregnant?" "When was  your last menstrual period?"     N/a  Protocols used: Leg Pain-A-AH

## 2023-03-01 ENCOUNTER — Other Ambulatory Visit: Payer: Self-pay | Admitting: Nurse Practitioner

## 2023-03-01 ENCOUNTER — Other Ambulatory Visit: Payer: Self-pay

## 2023-03-01 DIAGNOSIS — I10 Essential (primary) hypertension: Secondary | ICD-10-CM

## 2023-03-01 DIAGNOSIS — E7800 Pure hypercholesterolemia, unspecified: Secondary | ICD-10-CM

## 2023-03-01 DIAGNOSIS — N401 Enlarged prostate with lower urinary tract symptoms: Secondary | ICD-10-CM

## 2023-03-01 NOTE — Telephone Encounter (Signed)
Medication Refill - Medication: atorvastatin (LIPITOR) 20 MG tablet , amLODipine (NORVASC) 10 MG tablet ,  triamterene-hydrochlorothiazide (MAXZIDE-25) 37.5-25 MG tablet ,   Has the patient contacted their pharmacy? Yes.   No, more refills.   (Agent: If yes, when and what did the pharmacy advise?)  Preferred Pharmacy (with phone number or street name):  [facility]) [place], [place] - [address]  [address] [place] [address] [place] [address]  Phone: [phone] Fax: [phone]  Hours: Not open 24 hours   Has the patient been seen for an appointment in the last year OR does the patient have an upcoming appointment? Yes.    Agent: Please be advised that RX refills may take up to 3 business days. We ask that you follow-up with your pharmacy.

## 2023-03-02 MED ORDER — AMLODIPINE BESYLATE 10 MG PO TABS
10.0000 mg | ORAL_TABLET | Freq: Every day | ORAL | 0 refills | Status: DC
Start: 2023-03-02 — End: 2023-03-24

## 2023-03-02 MED ORDER — TAMSULOSIN HCL 0.4 MG PO CAPS: 0.4000 mg | ORAL_CAPSULE | Freq: Every day | ORAL | 1 refills | Status: DC

## 2023-03-02 MED ORDER — TRIAMTERENE-HCTZ 37.5-25 MG PO TABS
1.0000 | ORAL_TABLET | Freq: Every day | ORAL | 0 refills | Status: DC
Start: 2023-03-02 — End: 2023-03-24

## 2023-03-02 MED ORDER — ATORVASTATIN CALCIUM 20 MG PO TABS
20.0000 mg | ORAL_TABLET | Freq: Every day | ORAL | 0 refills | Status: DC
Start: 2023-03-02 — End: 2023-03-24

## 2023-03-02 NOTE — Telephone Encounter (Signed)
Requested Prescriptions  Pending Prescriptions Disp Refills   amLODipine (NORVASC) 10 MG tablet 90 tablet 0    Sig: Take 1 tablet (10 mg total) by mouth daily.     Cardiovascular: Calcium Channel Blockers 2 Passed - 03/01/2023  4:42 PM      Passed - Last BP in normal range    BP Readings from Last 1 Encounters:  09/21/22 138/66         Passed - Last Heart Rate in normal range    Pulse Readings from Last 1 Encounters:  09/21/22 71         Passed - Valid encounter within last 6 months    Recent Outpatient Visits           5 months ago Essential hypertension   [place] [facility] Larae Grooms, NP   6 months ago Essential hypertension   [facility] Larae Grooms, NP   7 months ago Essential hypertension   [place] [facility] Larae Grooms, NP   10 months ago Right hand pain   [place] [facility] Larae Grooms, NP   1 year ago Essential hypertension   [place] [facility] Larae Grooms, NP       Future Appointments             In 3 weeks Larae Grooms, NP [facility]   In 6 months Stoioff, Verna Czech, MD [facility] [place]             atorvastatin (LIPITOR) 20 MG tablet 90 tablet 0    Sig: Take 1 tablet (20 mg total) by mouth daily.     Cardiovascular:  Antilipid - Statins Failed - 03/01/2023  4:42 PM      Failed - Lipid Panel in normal range within the last 12 months    Cholesterol, Total  Date Value Ref Range Status  07/18/2022 139 100 - 199 mg/dL Final   LDL Chol Calc (NIH)  Date Value Ref Range Status  07/18/2022 72 0 - 99 mg/dL Final   HDL  Date Value Ref Range Status  07/18/2022 40 >39 mg/dL Final   Triglycerides  Date Value Ref Range Status  07/18/2022 156 (H) 0 - 149 mg/dL Final         Passed - Patient is not pregnant      Passed - Valid encounter within last 12 months    Recent Outpatient  Visits           5 months ago Essential hypertension   [place] [facility] Larae Grooms, NP   6 months ago Essential hypertension   [place] [facility] [place] Larae Grooms, NP   7 months ago Essential hypertension   [place] [facility] Larae Grooms, NP   10 months ago Right hand pain   [place] [facility] Larae Grooms, NP   1 year ago Essential hypertension   [facility] Larae Grooms, NP       Future Appointments             In 3 weeks Larae Grooms, NP  [facility]   In 6 months Stoioff, Verna Czech, MD [facility] [place]             triamterene-hydrochlorothiazide (MAXZIDE-25) 37.5-25 MG tablet 90 tablet 0    Sig: Take 1 tablet by mouth daily.  Cardiovascular: Diuretic Combos Failed - 03/01/2023  4:42 PM      Failed - K in normal range and within 180 days    Potassium  Date Value Ref Range Status  07/18/2022 4.1 3.5 - 5.2 mmol/L Final         Failed - Na in normal range and within 180 days    Sodium  Date Value Ref Range Status  07/18/2022 142 134 - 144 mmol/L Final         Failed - Cr in normal range and within 180 days    Creatinine, Ser  Date Value Ref Range Status  07/18/2022 0.80 0.76 - 1.27 mg/dL Final         Passed - Last BP in normal range    BP Readings from Last 1 Encounters:  09/21/22 138/66         Passed - Valid encounter within last 6 months    Recent Outpatient Visits           5 months ago Essential hypertension   [place] [facility] Larae Grooms, NP   6 months ago Essential hypertension   [place] [facility] Larae Grooms, NP   7 months ago Essential hypertension   [place] [facility] Larae Grooms, NP   10 months ago Right hand pain   [place] [facility] Larae Grooms, NP   1 year ago Essential  hypertension   [place] [facility] Larae Grooms, NP       Future Appointments             In 3 weeks Larae Grooms, NP [place] [facility]   In 6 months Stoioff, Verna Czech, MD [facility] [place]

## 2023-03-13 ENCOUNTER — Other Ambulatory Visit: Payer: Self-pay

## 2023-03-13 DIAGNOSIS — N401 Enlarged prostate with lower urinary tract symptoms: Secondary | ICD-10-CM

## 2023-03-13 DIAGNOSIS — R972 Elevated prostate specific antigen [PSA]: Secondary | ICD-10-CM

## 2023-03-14 ENCOUNTER — Other Ambulatory Visit: Payer: 59

## 2023-03-14 DIAGNOSIS — R972 Elevated prostate specific antigen [PSA]: Secondary | ICD-10-CM

## 2023-03-14 DIAGNOSIS — N401 Enlarged prostate with lower urinary tract symptoms: Secondary | ICD-10-CM

## 2023-03-15 LAB — PSA: Prostate Specific Ag, Serum: 22.2 ng/mL — ABNORMAL HIGH (ref 0.0–4.0)

## 2023-03-21 ENCOUNTER — Other Ambulatory Visit: Payer: Self-pay | Admitting: Orthopedic Surgery

## 2023-03-21 DIAGNOSIS — Z96649 Presence of unspecified artificial hip joint: Secondary | ICD-10-CM

## 2023-03-24 ENCOUNTER — Other Ambulatory Visit: Payer: Self-pay | Admitting: Nurse Practitioner

## 2023-03-24 ENCOUNTER — Ambulatory Visit: Payer: 59 | Admitting: Nurse Practitioner

## 2023-03-24 ENCOUNTER — Encounter: Payer: Self-pay | Admitting: *Deleted

## 2023-03-24 ENCOUNTER — Encounter: Payer: Self-pay | Admitting: Nurse Practitioner

## 2023-03-24 ENCOUNTER — Other Ambulatory Visit: Payer: Self-pay | Admitting: *Deleted

## 2023-03-24 VITALS — BP 136/78 | HR 97 | Temp 98.4°F | Ht 72.0 in | Wt 221.4 lb

## 2023-03-24 DIAGNOSIS — Z0000 Encounter for general adult medical examination without abnormal findings: Secondary | ICD-10-CM | POA: Diagnosis not present

## 2023-03-24 DIAGNOSIS — E7800 Pure hypercholesterolemia, unspecified: Secondary | ICD-10-CM

## 2023-03-24 DIAGNOSIS — I10 Essential (primary) hypertension: Secondary | ICD-10-CM

## 2023-03-24 DIAGNOSIS — N401 Enlarged prostate with lower urinary tract symptoms: Secondary | ICD-10-CM

## 2023-03-24 DIAGNOSIS — D696 Thrombocytopenia, unspecified: Secondary | ICD-10-CM

## 2023-03-24 DIAGNOSIS — R972 Elevated prostate specific antigen [PSA]: Secondary | ICD-10-CM

## 2023-03-24 DIAGNOSIS — R7303 Prediabetes: Secondary | ICD-10-CM | POA: Diagnosis not present

## 2023-03-24 LAB — URINALYSIS, ROUTINE W REFLEX MICROSCOPIC
Bilirubin, UA: NEGATIVE
Glucose, UA: NEGATIVE
Ketones, UA: NEGATIVE
Leukocytes,UA: NEGATIVE
Nitrite, UA: NEGATIVE
Protein,UA: NEGATIVE
RBC, UA: NEGATIVE
Specific Gravity, UA: 1.02 (ref 1.005–1.030)
Urobilinogen, Ur: 0.2 mg/dL (ref 0.2–1.0)
pH, UA: 7 (ref 5.0–7.5)

## 2023-03-24 MED ORDER — TRIAMTERENE-HCTZ 37.5-25 MG PO TABS: 1.0000 | ORAL_TABLET | Freq: Every day | ORAL | 0 refills | Status: DC

## 2023-03-24 MED ORDER — AMLODIPINE BESYLATE 10 MG PO TABS: 10.0000 mg | ORAL_TABLET | Freq: Every day | ORAL | 0 refills | Status: DC

## 2023-03-24 MED ORDER — ATORVASTATIN CALCIUM 20 MG PO TABS: 20.0000 mg | ORAL_TABLET | Freq: Every day | ORAL | 0 refills | Status: DC

## 2023-03-24 NOTE — Progress Notes (Signed)
BP 136/78   Pulse 97   Temp 98.4 F (36.9 C) (Oral)   Ht 6' (1.829 m)   Wt 221 lb 6.4 oz (100.4 kg)   SpO2 95%   BMI 30.03 kg/m    Subjective:    Patient ID: Chad Matthews, male    DOB: 04/11/1961, 62 y.o.   MRN: [id number]  HPI: Chad Matthews is a 62 y.o. male presenting on 03/24/2023 for comprehensive medical examination. Current medical complaints include:none  He currently lives with: Interim Problems from his last visit: no  HYPERTENSION / HYPERLIPIDEMIA Blood pressure is up due to being very stressed with his job. Satisfied with current treatment? yes Duration of hypertension: years BP monitoring frequency: not checking BP range:  BP medication side effects: no Past BP meds:  Maxide and amlodipine Duration of hyperlipidemia: years Cholesterol medication side effects: no Cholesterol supplements: none Past cholesterol medications: atorvastain (lipitor) Medication compliance: excellent compliance Aspirin: no Recent stressors: no Recurrent headaches: no Visual changes: no Palpitations: no Dyspnea: no Chest pain: no Lower extremity edema: no Dizzy/lightheaded: no  Patient states he is working with [employer] comp weight his right hip.  He is having use a cane to walk.  He is working with Haematologist.    Depression Screen done today and results listed below:     08/18/2022    8:08 AM 07/18/2022    8:31 AM 04/15/2022    8:53 AM 12/27/2021    4:16 PM 06/25/2021    8:36 AM  Depression screen PHQ 2/9  Decreased Interest 0 0 0 0 0  Down, Depressed, Hopeless 0 0 0 0 0  PHQ - 2 Score 0 0 0 0 0  Altered sleeping 0 0 0 0 0  Tired, decreased energy 0 0 0 0 0  Change in appetite 0 0 0 0 0  Feeling bad or failure about yourself  0 0 0 0 0  Trouble concentrating 0 0 0 0 0  Moving slowly or fidgety/restless 0 0 0 0 0  Suicidal thoughts 0 0 0 0 0  PHQ-9 Score 0 0 0 0 0  Difficult doing work/chores Not difficult at all Not difficult at all Not difficult at all  Not difficult at all Not difficult at all    The patient does not have a history of falls. I did complete a risk assessment for falls. A plan of care for falls was documented.   Past Medical History:  Past Medical History:  Diagnosis Date   Arthritis    Cataract    GERD (gastroesophageal reflux disease)    Hernia, hiatal    Hypertension     Surgical History:  Past Surgical History:  Procedure Laterality Date   bicep surgery Right    CATARACT EXTRACTION  08/2020   COLONOSCOPY WITH PROPOFOL N/A 08/27/2015   Procedure: COLONOSCOPY WITH PROPOFOL;  Surgeon: Scot Jun, MD;  Location: [facility] ENDOSCOPY;  Service: Endoscopy;  Laterality: N/A;   ELBOW SURGERY Left 1979   HERNIA REPAIR  1969   TOTAL HIP ARTHROPLASTY Right 08/08/2014   Procedure: RIGHT TOTAL HIP ARTHROPLASTY;  Surgeon: Frederico Hamman, MD;  Location: [facility] OR;  Service: Orthopedics;  Laterality: Right;    Medications:  Current Outpatient Medications on File Prior to Visit  Medication Sig   famotidine (PEPCID) 20 MG tablet Take 20 mg by mouth at bedtime.    Multiple Vitamins-Minerals (CENTRUM SILVER 50+MEN PO) Take by mouth daily.   tamsulosin (FLOMAX) 0.4 MG CAPS capsule Take  1 capsule (0.4 mg total) by mouth daily.   No current facility-administered medications on file prior to visit.    Allergies:  Allergies  Allergen Reactions   Bee Venom Anaphylaxis   Lisinopril Cough    Social History:  Social History   Socioeconomic History   Marital status: Married    Spouse name: Stanton Kidney   Number of children: Not on file   Years of education: College   Highest education level: Not on file  Occupational History   Occupation: Retail banker    Comment: Full-Time  Tobacco Use   Smoking status: Former    Current packs/day: 0.00    Average packs/day: 0.5 packs/day for 10.0 years (5.0 ttl pk-yrs)    Types: Cigarettes    Start date: 07/25/1974    Quit date: 07/25/1984    Years since quitting: 38.6   Smokeless  tobacco: Never  Vaping Use   Vaping status: Never Used  Substance and Sexual Activity   Alcohol use: Yes    Alcohol/week: 14.0 standard drinks of alcohol    Types: 14 Cans of beer per week    Comment: daily   Drug use: No   Sexual activity: Yes    Birth control/protection: None  Other Topics Concern   Not on file  Social History Narrative   Not on file   Social Determinants of Health   Financial Resource Strain: Not on file  Food Insecurity: Not on file  Transportation Needs: Not on file  Physical Activity: Not on file  Stress: Not on file  Social Connections: Not on file  Intimate Partner Violence: Not on file   Social History   Tobacco Use  Smoking Status Former   Current packs/day: 0.00   Average packs/day: 0.5 packs/day for 10.0 years (5.0 ttl pk-yrs)   Types: Cigarettes   Start date: 07/25/1974   Quit date: 07/25/1984   Years since quitting: 38.6  Smokeless Tobacco Never   Social History   Substance and Sexual Activity  Alcohol Use Yes   Alcohol/week: 14.0 standard drinks of alcohol   Types: 14 Cans of beer per week   Comment: daily    Family History:  Family History  Problem Relation Age of Onset   Healthy Mother    Cancer Father    Stroke Father    Transient ischemic attack Father    Lung cancer Father    Healthy Brother    Healthy Brother    Alzheimer's disease Maternal Grandmother    Osteoporosis Paternal Grandmother    Bladder Cancer Neg Hx    Kidney cancer Neg Hx    Prostate cancer Neg Hx     Past medical history, surgical history, medications, allergies, family history and social history reviewed with patient today and changes made to appropriate areas of the chart.   Review of Systems  Eyes:  Negative for blurred vision and double vision.  Respiratory:  Negative for shortness of breath.   Cardiovascular:  Negative for chest pain, palpitations and leg swelling.  Musculoskeletal:        Hip pain  Neurological:  Negative for dizziness  and headaches.   All other ROS negative except what is listed above and in the HPI.      Objective:    BP 136/78   Pulse 97   Temp 98.4 F (36.9 C) (Oral)   Ht 6' (1.829 m)   Wt 221 lb 6.4 oz (100.4 kg)   SpO2 95%   BMI 30.03 kg/m  Wt Readings from Last 3 Encounters:  03/24/23 221 lb 6.4 oz (100.4 kg)  09/21/22 216 lb 14.4 oz (98.4 kg)  09/12/22 218 lb (98.9 kg)    Physical Exam Vitals and nursing note reviewed.  Constitutional:      General: He is not in acute distress.    Appearance: Normal appearance. He is obese. He is not ill-appearing, toxic-appearing or diaphoretic.  HENT:     Head: Normocephalic.     Right Ear: Tympanic membrane, ear canal and external ear normal.     Left Ear: Tympanic membrane, ear canal and external ear normal.     Nose: Nose normal. No congestion or rhinorrhea.     Mouth/Throat:     Mouth: Mucous membranes are moist.  Eyes:     General:        Right eye: No discharge.        Left eye: No discharge.     Extraocular Movements: Extraocular movements intact.     Conjunctiva/sclera: Conjunctivae normal.     Pupils: Pupils are equal, round, and reactive to light.  Cardiovascular:     Rate and Rhythm: Normal rate and regular rhythm.     Heart sounds: No murmur heard. Pulmonary:     Effort: Pulmonary effort is normal. No respiratory distress.     Breath sounds: Normal breath sounds. No wheezing, rhonchi or rales.  Abdominal:     General: Abdomen is flat. Bowel sounds are normal. There is no distension.     Palpations: Abdomen is soft.     Tenderness: There is no abdominal tenderness. There is no guarding.  Musculoskeletal:     Cervical back: Normal range of motion and neck supple.  Skin:    General: Skin is warm and dry.     Capillary Refill: Capillary refill takes less than 2 seconds.  Neurological:     General: No focal deficit present.     Mental Status: He is alert and oriented to person, place, and time.     Cranial Nerves: No  cranial nerve deficit.     Motor: No weakness.     Deep Tendon Reflexes: Reflexes normal.  Psychiatric:        Mood and Affect: Mood normal.        Behavior: Behavior normal.        Thought Content: Thought content normal.        Judgment: Judgment normal.     Results for orders placed or performed in visit on 03/14/23  PSA  Result Value Ref Range   Prostate Specific Ag, Serum 22.2 (H) 0.0 - 4.0 ng/mL      Assessment & Plan:   Problem List Items Addressed This Visit       Cardiovascular and Mediastinum   Essential hypertension    Chronic.  Controlled.  Continue with current medication regimen of Amlodipine and Maxide.  Refills sent today.  Recommend checking blood pressures at home and bringing log to next visit.  Return to clinic in 6 months for reevaluation.  Call sooner if concerns arise.        Relevant Medications   amLODipine (NORVASC) 10 MG tablet   atorvastatin (LIPITOR) 20 MG tablet   triamterene-hydrochlorothiazide (MAXZIDE-25) 37.5-25 MG tablet     Other   Hypercholesteremia    Chronic.  Controlled.  Continue with current medication regimen of Atorvastatin daily.  Labs ordered today.  Return to clinic in 6 months for reevaluation.  Call sooner if concerns arise.  The 10-year ASCVD risk score (Arnett DK, et al., 2019) is: 13.3%   Values used to calculate the score:     Age: 2 years     Sex: Male     Is Non-Hispanic African American: No     Diabetic: No     Tobacco smoker: No     Systolic Blood Pressure: 136 mmHg     Is BP treated: Yes     HDL Cholesterol: 40 mg/dL     Total Cholesterol: 139 mg/dL       Relevant Medications   amLODipine (NORVASC) 10 MG tablet   atorvastatin (LIPITOR) 20 MG tablet   triamterene-hydrochlorothiazide (MAXZIDE-25) 37.5-25 MG tablet   Other Relevant Orders   Lipid panel   Prediabetes    Chronic.  Controlled.  Last A1c was 6.1% in February 2024. Continue with low carb diet.  Labs ordered today.  Follow up in 6 months.  Call sooner if concerns arise.        Relevant Orders   HgB A1c   Other Visit Diagnoses     Annual physical exam    -  Primary   Health maintenance reviewed during visit today.  Labs ordered.  Vaccines up to date.  Colon cancer screening up to date.   Relevant Orders   TSH   PSA   Lipid panel   CBC with Differential/Platelet   Comprehensive metabolic panel   Urinalysis, Routine w reflex microscopic   Hypercholesterolemia       Relevant Medications   amLODipine (NORVASC) 10 MG tablet   atorvastatin (LIPITOR) 20 MG tablet   triamterene-hydrochlorothiazide (MAXZIDE-25) 37.5-25 MG tablet        Discussed aspirin prophylaxis for myocardial infarction prevention and decision was it was not indicated  LABORATORY TESTING:  Health maintenance labs ordered today as discussed above.   The natural history of prostate cancer and ongoing controversy regarding screening and potential treatment outcomes of prostate cancer has been discussed with the patient. The meaning of a false positive PSA and a false negative PSA has been discussed. He indicates understanding of the limitations of this screening test and wishes to proceed with screening PSA testing.   IMMUNIZATIONS:   - Tdap: Tetanus vaccination status reviewed: last tetanus booster within 10 years. - Influenza: up to date - Pneumovax: Not applicable - Prevnar: Not applicable - COVID: up to date - HPV: Not applicable - Shingrix vaccine: Up to date  SCREENING: - Colonoscopy: Up to date  Discussed with patient purpose of the colonoscopy is to detect colon cancer at curable precancerous or early stages   - AAA Screening: Not applicable  -Hearing Test: Not applicable  -Spirometry: Not applicable   PATIENT COUNSELING:    Sexuality: Discussed sexually transmitted diseases, partner selection, use of condoms, avoidance of unintended pregnancy  and contraceptive alternatives.   Advised to avoid cigarette smoking.  I discussed  with the patient that most people either abstain from alcohol or drink within safe limits (<=14/week and <=4 drinks/occasion for males, <=7/weeks and <= 3 drinks/occasion for females) and that the risk for alcohol disorders and other health effects rises proportionally with the number of drinks per week and how often a drinker exceeds daily limits.  Discussed cessation/primary prevention of drug use and availability of treatment for abuse.   Diet: Encouraged to adjust caloric intake to maintain  or achieve ideal body weight, to reduce intake of dietary saturated fat and total fat, to limit sodium intake by avoiding high sodium foods  and not adding table salt, and to maintain adequate dietary potassium and calcium preferably from fresh fruits, vegetables, and low-fat dairy products.    stressed the importance of regular exercise  Injury prevention: Discussed safety belts, safety helmets, smoke detector, smoking near bedding or upholstery.   Dental health: Discussed importance of regular tooth brushing, flossing, and dental visits.   Follow up plan: NEXT PREVENTATIVE PHYSICAL DUE IN 1 YEAR. Return in about 6 months (around 09/22/2023) for HTN, HLD, DM2 FU.

## 2023-03-24 NOTE — Assessment & Plan Note (Signed)
Chronic.  Controlled.  Continue with current medication regimen of Atorvastatin daily.  Labs ordered today.  Return to clinic in 6 months for reevaluation.  Call sooner if concerns arise.   The 10-year ASCVD risk score (Arnett DK, et al., 2019) is: 13.3%   Values used to calculate the score:     Age: 62 years     Sex: Male     Is Non-Hispanic African American: No     Diabetic: No     Tobacco smoker: No     Systolic Blood Pressure: 136 mmHg     Is BP treated: Yes     HDL Cholesterol: 40 mg/dL     Total Cholesterol: 139 mg/dL

## 2023-03-24 NOTE — Assessment & Plan Note (Signed)
Chronic.  Controlled.  Last A1c was 6.1% in February 2024. Continue with low carb diet.  Labs ordered today.  Follow up in 6 months. Call sooner if concerns arise.

## 2023-03-24 NOTE — Assessment & Plan Note (Signed)
Chronic.  Controlled.  Continue with current medication regimen of Amlodipine and Maxide.  Refills sent today.  Recommend checking blood pressures at home and bringing log to next visit.  Return to clinic in 6 months for reevaluation.  Call sooner if concerns arise.

## 2023-03-25 LAB — CBC WITH DIFFERENTIAL/PLATELET
Basophils Absolute: 0 10*3/uL (ref 0.0–0.2)
Basos: 1 %
EOS (ABSOLUTE): 0.2 10*3/uL (ref 0.0–0.4)
Eos: 3 %
Hematocrit: 44.4 % (ref 37.5–51.0)
Hemoglobin: 14.6 g/dL (ref 13.0–17.7)
Immature Grans (Abs): 0 10*3/uL (ref 0.0–0.1)
Immature Granulocytes: 0 %
Lymphocytes Absolute: 1.2 10*3/uL (ref 0.7–3.1)
Lymphs: 22 %
MCH: 32.7 pg (ref 26.6–33.0)
MCHC: 32.9 g/dL (ref 31.5–35.7)
MCV: 100 fL — ABNORMAL HIGH (ref 79–97)
Monocytes Absolute: 0.5 10*3/uL (ref 0.1–0.9)
Monocytes: 8 %
Neutrophils Absolute: 3.5 10*3/uL (ref 1.4–7.0)
Neutrophils: 66 %
Platelets: 89 10*3/uL — CL (ref 150–450)
RBC: 4.46 x10E6/uL (ref 4.14–5.80)
RDW: 12 % (ref 11.6–15.4)
WBC: 5.3 10*3/uL (ref 3.4–10.8)

## 2023-03-25 LAB — COMPREHENSIVE METABOLIC PANEL
ALT: 29 [IU]/L (ref 0–44)
AST: 26 [IU]/L (ref 0–40)
Albumin: 4.8 g/dL (ref 3.9–4.9)
Alkaline Phosphatase: 133 [IU]/L — ABNORMAL HIGH (ref 44–121)
BUN/Creatinine Ratio: 15 (ref 10–24)
BUN: 12 mg/dL (ref 8–27)
Bilirubin Total: 0.8 mg/dL (ref 0.0–1.2)
CO2: 21 mmol/L (ref 20–29)
Calcium: 9.6 mg/dL (ref 8.6–10.2)
Chloride: 99 mmol/L (ref 96–106)
Creatinine, Ser: 0.81 mg/dL (ref 0.76–1.27)
Globulin, Total: 2.8 g/dL (ref 1.5–4.5)
Glucose: 142 mg/dL — ABNORMAL HIGH (ref 70–99)
Potassium: 3.8 mmol/L (ref 3.5–5.2)
Sodium: 139 mmol/L (ref 134–144)
Total Protein: 7.6 g/dL (ref 6.0–8.5)
eGFR: 98 mL/min/{1.73_m2} (ref >59)

## 2023-03-25 LAB — LIPID PANEL
Chol/HDL Ratio: 2.9 ratio (ref 0.0–5.0)
Cholesterol, Total: 143 mg/dL (ref 100–199)
HDL: 50 mg/dL (ref >39)
LDL Chol Calc (NIH): 67 mg/dL (ref 0–99)
Triglycerides: 149 mg/dL (ref 0–149)
VLDL Cholesterol Cal: 26 mg/dL (ref 5–40)

## 2023-03-25 LAB — TSH: TSH: 1.47 u[IU]/mL (ref 0.450–4.500)

## 2023-03-25 LAB — PSA: Prostate Specific Ag, Serum: 18.5 ng/mL — ABNORMAL HIGH (ref 0.0–4.0)

## 2023-03-25 LAB — HEMOGLOBIN A1C
Est. average glucose Bld gHb Est-mCnc: 140 mg/dL
Hgb A1c MFr Bld: 6.5 % — ABNORMAL HIGH (ref 4.8–5.6)

## 2023-03-27 ENCOUNTER — Other Ambulatory Visit: Payer: Self-pay | Admitting: Orthopedic Surgery

## 2023-03-27 NOTE — Telephone Encounter (Signed)
Requested medication (s) are due for refill today: yes  Requested medication (s) are on the active medication list: yes  Last refill:  03/02/23  Future visit scheduled: yes  Notes to clinic:  Unable to refill per protocol, last refill by another provider not at this practice.     Requested Prescriptions  Pending Prescriptions Disp Refills   tamsulosin (FLOMAX) 0.4 MG CAPS capsule [Pharmacy Med Name: Tamsulosin HCl 0.4 MG Oral Capsule] 90 capsule 3    Sig: TAKE 1 CAPSULE BY MOUTH DAILY     Urology: Alpha-Adrenergic Blocker Failed - 03/24/2023 10:32 AM      Failed - PSA in normal range and within 360 days    Prostate Specific Ag, Serum  Date Value Ref Range Status  03/24/2023 18.5 (H) 0.0 - 4.0 ng/mL Final    Comment:    Roche ECLIA methodology. According to the American Urological Association, Serum PSA should decrease and remain at undetectable levels after radical prostatectomy. The AUA defines biochemical recurrence as an initial PSA value 0.2 ng/mL or greater followed by a subsequent confirmatory PSA value 0.2 ng/mL or greater. Values obtained with different assay methods or kits cannot be used interchangeably. Results cannot be interpreted as absolute evidence of the presence or absence of malignant disease.          Passed - Last BP in normal range    BP Readings from Last 1 Encounters:  03/24/23 136/78         Passed - Valid encounter within last 12 months    Recent Outpatient Visits           3 days ago Annual physical exam   [facility] [facility] Larae Grooms, NP   6 months ago Essential hypertension   [place] [facility] Larae Grooms, NP   7 months ago Essential hypertension   [place] [facility] Larae Grooms, NP   8 months ago Essential hypertension   [facility] Larae Grooms, NP   11 months ago Right hand pain   [facility] Larae Grooms, NP       Future Appointments             In 5 months Stoioff, Verna Czech, MD [facility] [place]   In 5 months Larae Grooms, NP [place] [facility]

## 2023-03-27 NOTE — Addendum Note (Signed)
Addended by: Larae Grooms on: 03/27/2023 10:55 AM   Modules accepted: Orders

## 2023-03-28 ENCOUNTER — Encounter: Payer: Self-pay | Admitting: Nurse Practitioner

## 2023-03-29 ENCOUNTER — Telehealth: Payer: Self-pay | Admitting: Urology

## 2023-03-29 ENCOUNTER — Encounter: Payer: Self-pay | Admitting: Urology

## 2023-03-29 ENCOUNTER — Other Ambulatory Visit: Payer: Self-pay | Admitting: *Deleted

## 2023-03-29 ENCOUNTER — Ambulatory Visit
Admission: RE | Admit: 2023-03-29 | Discharge: 2023-03-29 | Discharge status: Home / Self Care | Payer: 59 | Source: Ambulatory Visit | Attending: Orthopedic Surgery | Admitting: Orthopedic Surgery

## 2023-03-29 ENCOUNTER — Ambulatory Visit
Admission: RE | Admit: 2023-03-29 | Discharge: 2023-03-29 | Disposition: A | Discharge status: Home / Self Care | Payer: 59 | Source: Ambulatory Visit | Attending: Orthopedic Surgery | Admitting: Orthopedic Surgery

## 2023-03-29 DIAGNOSIS — N401 Enlarged prostate with lower urinary tract symptoms: Secondary | ICD-10-CM

## 2023-03-29 DIAGNOSIS — Z96649 Presence of unspecified artificial hip joint: Secondary | ICD-10-CM | POA: Insufficient documentation

## 2023-03-29 DIAGNOSIS — T8484XA Pain due to internal orthopedic prosthetic devices, implants and grafts, initial encounter: Secondary | ICD-10-CM | POA: Insufficient documentation

## 2023-03-29 MED ORDER — TECHNETIUM TC 99M MEDRONATE IV KIT
20.0000 | PACK | Freq: Once | INTRAVENOUS | Status: AC | PRN
  Administered 2023-03-29: 19.38 via INTRAVENOUS

## 2023-03-29 MED ORDER — TAMSULOSIN HCL 0.4 MG PO CAPS: 0.4000 mg | ORAL_CAPSULE | Freq: Every day | ORAL | 1 refills | Status: DC

## 2023-03-29 NOTE — Telephone Encounter (Signed)
Pt needs refill for Flomax sent to [facility].

## 2023-03-29 NOTE — Telephone Encounter (Signed)
Sent medication

## 2023-05-15 ENCOUNTER — Ambulatory Visit
Admission: RE | Admit: 2023-05-15 | Discharge: 2023-05-15 | Disposition: A | Discharge status: Home / Self Care | Payer: 59 | Source: Ambulatory Visit | Attending: Urology | Admitting: Urology

## 2023-05-15 DIAGNOSIS — R972 Elevated prostate specific antigen [PSA]: Secondary | ICD-10-CM | POA: Insufficient documentation

## 2023-05-15 MED ORDER — GADOBUTROL 1 MMOL/ML IV SOLN
10.0000 mL | Freq: Once | INTRAVENOUS | Status: AC | PRN
  Administered 2023-05-15: 10 mL via INTRAVENOUS

## 2023-06-11 ENCOUNTER — Other Ambulatory Visit: Payer: Self-pay | Admitting: Nurse Practitioner

## 2023-06-11 DIAGNOSIS — I10 Essential (primary) hypertension: Secondary | ICD-10-CM

## 2023-06-13 NOTE — Telephone Encounter (Signed)
 Requested Prescriptions  Pending Prescriptions Disp Refills   triamterene -hydrochlorothiazide (MAXZIDE-25) 37.5-25 MG tablet [Pharmacy Med Name: Triamterene -HCTZ 37.5-25 MG Oral Tablet] 90 tablet 0    Sig: TAKE 1 TABLET BY MOUTH DAILY     Cardiovascular: Diuretic Combos Passed - 06/13/2023 10:00 AM      Passed - K in normal range and within 180 days    Potassium  Date Value Ref Range Status  03/24/2023 3.8 3.5 - 5.2 mmol/L Final         Passed - Na in normal range and within 180 days    Sodium  Date Value Ref Range Status  03/24/2023 139 134 - 144 mmol/L Final         Passed - Cr in normal range and within 180 days    Creatinine, Ser  Date Value Ref Range Status  03/24/2023 0.81 0.76 - 1.27 mg/dL Final         Passed - Last BP in normal range    BP Readings from Last 1 Encounters:  03/24/23 136/78         Passed - Valid encounter within last 6 months    Recent Outpatient Visits           2 months ago Annual physical exam   [place] [facility] Melvin Pao, NP   8 months ago Essential hypertension   [place] [facility] Melvin Pao, NP   9 months ago Essential hypertension   [place] [facility] Melvin Pao, NP   11 months ago Essential hypertension   [facility] Melvin Pao, NP   1 year ago Right hand pain   [place] [facility] Melvin Pao, NP       Future Appointments             In 3 months Stoioff, Glendia BROCKS, MD [facility] [place]   In 3 months Melvin Pao, NP [place] [facility], [facility]

## 2023-06-24 ENCOUNTER — Other Ambulatory Visit: Payer: Self-pay | Admitting: Nurse Practitioner

## 2023-06-24 DIAGNOSIS — I10 Essential (primary) hypertension: Secondary | ICD-10-CM

## 2023-06-24 DIAGNOSIS — E7800 Pure hypercholesterolemia, unspecified: Secondary | ICD-10-CM

## 2023-06-26 NOTE — Telephone Encounter (Signed)
Requested Prescriptions  Pending Prescriptions Disp Refills   amLODipine (NORVASC) 10 MG tablet [Pharmacy Med Name: amLODIPine Besylate 10 MG Oral Tablet] 90 tablet 0    Sig: TAKE 1 TABLET BY MOUTH DAILY     Cardiovascular: Calcium Channel Blockers 2 Passed - 06/26/2023  5:28 PM      Passed - Last BP in normal range    BP Readings from Last 1 Encounters:  03/24/23 136/78         Passed - Last Heart Rate in normal range    Pulse Readings from Last 1 Encounters:  03/24/23 97         Passed - Valid encounter within last 6 months    Recent Outpatient Visits           3 months ago Annual physical exam   [place] [facility] Larae Grooms, NP   9 months ago Essential hypertension   [place] [facility] Larae Grooms, NP   10 months ago Essential hypertension   [place] [facility] Larae Grooms, NP   11 months ago Essential hypertension   [place] [facility] Larae Grooms, NP   1 year ago Right hand pain   [place] [facility] Larae Grooms, NP       Future Appointments             In 2 months Stoioff, Verna Czech, MD [facility] [place]   In 2 months Larae Grooms, NP [place] [facility], [facility]             atorvastatin (LIPITOR) 20 MG tablet [Pharmacy Med Name: Atorvastatin Calcium 20 MG Oral Tablet] 90 tablet 2    Sig: TAKE 1 TABLET BY MOUTH DAILY     Cardiovascular:  Antilipid - Statins Failed - 06/26/2023  5:28 PM      Failed - Lipid Panel in normal range within the last 12 months    Cholesterol, Total  Date Value Ref Range Status  03/24/2023 143 100 - 199 mg/dL Final   LDL Chol Calc (NIH)  Date Value Ref Range Status  03/24/2023 67 0 - 99 mg/dL Final   HDL  Date Value Ref Range Status  03/24/2023 50 >39 mg/dL Final   Triglycerides  Date Value Ref Range Status  03/24/2023 149 0 - 149 mg/dL Final         Passed - Patient is not  pregnant      Passed - Valid encounter within last 12 months    Recent Outpatient Visits           3 months ago Annual physical exam   [place] [facility] Larae Grooms, NP   9 months ago Essential hypertension   [place] [facility] Larae Grooms, NP   10 months ago Essential hypertension   [place] [facility] Larae Grooms, NP   11 months ago Essential hypertension   [place] [facility] Larae Grooms, NP   1 year ago Right hand pain    [facility] Larae Grooms, NP       Future Appointments             In 2 months Stoioff, Verna Czech, MD [facility] [place]   In 2 months Larae Grooms, NP  [facility], [facility]

## 2023-08-10 ENCOUNTER — Ambulatory Visit: Payer: Self-pay | Admitting: Nurse Practitioner

## 2023-08-10 NOTE — Telephone Encounter (Signed)
 Patient called-patient requesting advise on what OTC medications he would be able to try with his head cold. Per chart review, patient is currently on BP medications. Patient is given suggestions of OTC medications specifically for patients with high blood pressure I.e Coricidin HBP or Dayquil.Nyquil HBP. Patient states he will be going to his pharmacy-encouraged to speak with pharmacy if he has any additional questions while he is there. No other needs at this time.   Copied From CRM [id number]. Reason for Triage: sever head cold,want advice on anything he can take over the counter to help relieve    Reason for Disposition  Health Information question, no triage required and triager able to answer question  Answer Assessment - Initial Assessment Questions 1. REASON FOR CALL or QUESTION: "What is your reason for calling today?" or "How can I best help you?" or "What question do you have that I can help answer?"     Patient calling to ask what OTC medications he can take for a head cold.  Protocols used: Information Only Call - No Triage-A-AH

## 2023-09-02 ENCOUNTER — Other Ambulatory Visit: Payer: Self-pay | Admitting: Nurse Practitioner

## 2023-09-02 DIAGNOSIS — I10 Essential (primary) hypertension: Secondary | ICD-10-CM

## 2023-09-04 NOTE — Telephone Encounter (Signed)
 Patient must keep upcoming appointment for additional refills. Requested Prescriptions  Pending Prescriptions Disp Refills   amLODipine (NORVASC) 10 MG tablet [Pharmacy Med Name: amLODIPine Besylate 10 MG Oral Tablet] 90 tablet 0    Sig: TAKE 1 TABLET BY MOUTH DAILY     Cardiovascular: Calcium Channel Blockers 2 Failed - 09/04/2023  3:13 PM      Failed - Valid encounter within last 6 months    Recent Outpatient Visits   None     Future Appointments             In 1 week Stoioff, Verna Czech, MD [facility] [place]   In 3 weeks Larae Grooms, NP [place]            Passed - Last BP in normal range    BP Readings from Last 1 Encounters:  03/24/23 136/78         Passed - Last Heart Rate in normal range    Pulse Readings from Last 1 Encounters:  03/24/23 97          triamterene-hydrochlorothiazide (MAXZIDE-25) 37.5-25 MG tablet [Pharmacy Med Name: Triamterene-HCTZ 37.5-25 MG Oral Tablet] 90 tablet 0    Sig: TAKE 1 TABLET BY MOUTH DAILY     Cardiovascular: Diuretic Combos Failed - 09/04/2023  3:13 PM      Failed - Valid encounter within last 6 months    Recent Outpatient Visits   None     Future Appointments             In 1 week Stoioff, Verna Czech, MD [facility] [place]   In 3 weeks Larae Grooms, NP [place] [facility]            Passed - K in normal range and within 180 days    Potassium  Date Value Ref Range Status  03/24/2023 3.8 3.5 - 5.2 mmol/L Final         Passed - Na in normal range and within 180 days    Sodium  Date Value Ref Range Status  03/24/2023 139 134 - 144 mmol/L Final         Passed - Cr in normal range and within 180 days    Creatinine, Ser  Date Value Ref Range Status  03/24/2023 0.81 0.76 - 1.27 mg/dL Final         Passed - Last BP in normal range    BP Readings from Last 1 Encounters:  03/24/23 136/78

## 2023-09-11 ENCOUNTER — Other Ambulatory Visit: Payer: Self-pay | Admitting: *Deleted

## 2023-09-11 DIAGNOSIS — R972 Elevated prostate specific antigen [PSA]: Secondary | ICD-10-CM

## 2023-09-12 ENCOUNTER — Other Ambulatory Visit: Payer: Self-pay

## 2023-09-12 DIAGNOSIS — R972 Elevated prostate specific antigen [PSA]: Secondary | ICD-10-CM

## 2023-09-13 ENCOUNTER — Ambulatory Visit: Payer: Self-pay | Admitting: Urology

## 2023-09-13 ENCOUNTER — Encounter: Payer: Self-pay | Admitting: Urology

## 2023-09-13 VITALS — BP 137/75 | HR 68 | Ht 72.0 in | Wt 225.0 lb

## 2023-09-13 DIAGNOSIS — N401 Enlarged prostate with lower urinary tract symptoms: Secondary | ICD-10-CM

## 2023-09-13 DIAGNOSIS — R972 Elevated prostate specific antigen [PSA]: Secondary | ICD-10-CM | POA: Diagnosis not present

## 2023-09-13 LAB — PSA: Prostate Specific Ag, Serum: 16.8 ng/mL — ABNORMAL HIGH (ref 0.0–4.0)

## 2023-09-13 NOTE — Progress Notes (Signed)
 I, Maysun Anabel Bene, acting as a scribe for Riki Altes, MD., have documented all relevant documentation on the behalf of Riki Altes, MD, as directed by Riki Altes, MD while in the presence of Riki Altes, MD.  09/13/2023 3:27 PM   Oneida Alar August 23, 1960 [id number]  Referring provider: Larae Grooms, NP [address] [place],  [place] [address]  Chief Complaint  Patient presents with   Elevated PSA   Urologic history: 1. Elevated PSA Prostate biopsy 04/2017; PSA 7.7; prostate volume 79 g; benign pathology PSA June 2019 9.7; prostate MRI no suspicious lesions; volume 105 g PSA bump 14.2 01/2020; repeat biopsy 02/2020 benign; 115 g prostate MRI 05/15/23 for PSA bump 22.2 which showed increased gland size to 140 cc. No lesions suspicious for high-grade prostate cancer or indeterminate lesions.    2. BPH with LUTS On Tamsulosin   HPI: Chad Matthews is a 63 y.o. male presents for annual follow-up.  No significant changes since his last follow-up.  Remains on tamsulosin Denies dysuria, gross hematuria Denies flank, abdominal or pelvic pain PSA October had increased to 22.2 and was repeated at 18.5 Most recent PSA 09/12/2023 was 16.8.  PSA trend   Prostate Specific Ag, Serum  Latest Ref Rng 0.0 - 4.0 ng/mL  12/04/2018 9.4 (H)   06/27/2019 10.2 (H)   12/23/2019 13.7 (H)   02/04/2020 14.2 (H)   08/27/2020 11.3 (H)   12/23/2020 13.4 (H)   12/31/2020 13.5 (H)   09/02/2021 14.4 (H)   12/27/2021 12.8 (H)   09/09/2022 12.7 (H)   03/14/2023 22.2 (H)   03/24/2023 18.5 (H)   09/12/2023 16.8 (H)      PMH: Past Medical History:  Diagnosis Date   Arthritis    Cataract    GERD (gastroesophageal reflux disease)    Hernia, hiatal    Hypertension     Surgical History: Past Surgical History:  Procedure Laterality Date   bicep surgery Right    CATARACT EXTRACTION  08/2020   COLONOSCOPY WITH PROPOFOL N/A 08/27/2015   Procedure: COLONOSCOPY WITH PROPOFOL;   Surgeon: Scot Jun, MD;  Location: [facility] ENDOSCOPY;  Service: Endoscopy;  Laterality: N/A;   ELBOW SURGERY Left 1979   HERNIA REPAIR  1969   TOTAL HIP ARTHROPLASTY Right 08/08/2014   Procedure: RIGHT TOTAL HIP ARTHROPLASTY;  Surgeon: Frederico Hamman, MD;  Location: [facility] OR;  Service: Orthopedics;  Laterality: Right;    Home Medications:  Allergies as of 09/13/2023       Reactions   Bee Venom Anaphylaxis   Lisinopril Cough        Medication List        Accurate as of September 13, 2023  3:27 PM. If you have any questions, ask your nurse or doctor.          amLODipine 10 MG tablet Commonly known as: NORVASC TAKE 1 TABLET BY MOUTH DAILY   atorvastatin 20 MG tablet Commonly known as: LIPITOR TAKE 1 TABLET BY MOUTH DAILY   CENTRUM SILVER 50+MEN PO Take by mouth daily.   famotidine 20 MG tablet Commonly known as: PEPCID Take 20 mg by mouth at bedtime.   tamsulosin 0.4 MG Caps capsule Commonly known as: FLOMAX Take 1 capsule (0.4 mg total) by mouth daily.   triamterene-hydrochlorothiazide 37.5-25 MG tablet Commonly known as: MAXZIDE-25 TAKE 1 TABLET BY MOUTH DAILY        Allergies:  Allergies  Allergen Reactions   Bee Venom  Anaphylaxis   Lisinopril Cough    Family History: Family History  Problem Relation Age of Onset   Healthy Mother    Cancer Father    Stroke Father    Transient ischemic attack Father    Lung cancer Father    Healthy Brother    Healthy Brother    Alzheimer's disease Maternal Grandmother    Osteoporosis Paternal Grandmother    Bladder Cancer Neg Hx    Kidney cancer Neg Hx    Prostate cancer Neg Hx     Social History:  reports that he quit smoking about 39 years ago. His smoking use included cigarettes. He started smoking about 49 years ago. He has a 5 pack-year smoking history. He has never used smokeless tobacco. He reports current alcohol use of about 14.0 standard drinks of alcohol per week. He reports that he does not use  drugs.   Physical Exam: BP 137/75   Pulse 68   Ht 6' (1.829 m)   Wt 225 lb (102.1 kg)   BMI 30.52 kg/m   Constitutional:  Alert and oriented, No acute distress. HEENT: [place] AT Respiratory: Normal respiratory effort, no increased work of breathing Psychiatric: Normal mood and affect.   Assessment & Plan:    1. Elevated PSA PSA has risen slightly Prostate MRI again shows no lesion suspicious for high-grade prostate cancer, and prostate volume has increased to 140 cc (previously 107 cc).  Lab visit 6 months with PSA, and 1 year office visit PSA/DRE.   2. BPH with LUTS Stable on tamsulosin 0.8 mg.  We again discussed adding a 5-ARI medication. HoLEP was also discussed. He is retiring next year and may be interested in pursuing HoLEP in the future.  I have reviewed the above documentation for accuracy and completeness, and I agree with the above.   Geraline Knapp, MD  [facility] [address], [address] [place], [place] [address] [phone]

## 2023-09-22 ENCOUNTER — Ambulatory Visit: Payer: Self-pay | Admitting: Nurse Practitioner

## 2023-09-26 ENCOUNTER — Ambulatory Visit (INDEPENDENT_AMBULATORY_CARE_PROVIDER_SITE_OTHER): Admitting: Nurse Practitioner

## 2023-09-26 ENCOUNTER — Encounter: Payer: Self-pay | Admitting: Nurse Practitioner

## 2023-09-26 VITALS — BP 134/65 | HR 78 | Temp 98.2°F | Resp 15 | Ht 72.01 in | Wt 224.2 lb

## 2023-09-26 DIAGNOSIS — I10 Essential (primary) hypertension: Secondary | ICD-10-CM

## 2023-09-26 DIAGNOSIS — E7800 Pure hypercholesterolemia, unspecified: Secondary | ICD-10-CM | POA: Diagnosis not present

## 2023-09-26 DIAGNOSIS — R7303 Prediabetes: Secondary | ICD-10-CM

## 2023-09-26 MED ORDER — TRIAMTERENE-HCTZ 37.5-25 MG PO TABS: 1.0000 | ORAL_TABLET | Freq: Every day | ORAL | 1 refills | Status: DC

## 2023-09-26 MED ORDER — ATORVASTATIN CALCIUM 20 MG PO TABS: 20.0000 mg | ORAL_TABLET | Freq: Every day | ORAL | 1 refills | Status: DC

## 2023-09-26 MED ORDER — AMLODIPINE BESYLATE 10 MG PO TABS: 10.0000 mg | ORAL_TABLET | Freq: Every day | ORAL | 1 refills | Status: DC

## 2023-09-26 NOTE — Assessment & Plan Note (Signed)
 Chronic.  Controlled.  Continue with current medication regimen of Amlodipine  and Maxide.  Refills sent today.   Return to clinic in 6 months for reevaluation.  Call sooner if concerns arise.

## 2023-09-26 NOTE — Assessment & Plan Note (Signed)
 Labs ordered at visit today.  Will make recommendations based on lab results.

## 2023-09-26 NOTE — Assessment & Plan Note (Signed)
 Chronic.  Controlled.  Continue with current medication regimen of Atorvastatin  daily.  Refills sent today.  Labs ordered today.  Return to clinic in 6 months for reevaluation.  Call sooner if concerns arise.   The 10-year ASCVD risk score (Arnett DK, et al., 2019) is: 11.6%   Values used to calculate the score:     Age: 63 years     Sex: Male     Is Non-Hispanic African American: No     Diabetic: No     Tobacco smoker: No     Systolic Blood Pressure: 134 mmHg     Is BP treated: Yes     HDL Cholesterol: 50 mg/dL     Total Cholesterol: 143 mg/dL

## 2023-09-26 NOTE — Progress Notes (Signed)
 BP 134/65 (BP Location: Right Arm, Patient Position: Sitting, Cuff Size: Normal)   Pulse 78   Temp 98.2 F (36.8 C) (Oral)   Resp 15   Ht 6' 0.01" (1.829 m)   Wt 224 lb 3.2 oz (101.7 kg)   SpO2 98%   BMI 30.40 kg/m    Subjective:    Patient ID: Chad Matthews, male    DOB: December 31, 1960, 63 y.o.   MRN: [id number]  HPI: Chad Matthews is a 63 y.o. male  Chief Complaint  Patient presents with   Hypertension    No home checks   Knee Pain    Left knee while working about in [place]  with a tool bag last month and continues to bother him. Would like to be seen by previous ortho.    Urology    Says prostate is large but PSA is coming down and possible surgery in the future    HYPERTENSION / HYPERLIPIDEMIA Satisfied with current treatment? yes Duration of hypertension: years BP monitoring frequency: not checking BP range:  BP medication side effects: no Past BP meds: Maxide and amlodipine  Duration of hyperlipidemia: years Cholesterol medication side effects: no Cholesterol supplements: none Past cholesterol medications: atorvastain (lipitor) Medication compliance: excellent compliance Aspirin: no Recent stressors: no Recurrent headaches: no Visual changes: no Palpitations: no Dyspnea: no Chest pain: no Lower extremity edema: no Dizzy/lightheaded: no  Patient states he is having knee pain.  He hit it a couple of times in [place]  with his tool pouch and has continued to hurt since then.  Plans to see ortho if not improved.    Relevant past medical, surgical, family and social history reviewed and updated as indicated. Interim medical history since our last visit reviewed. Allergies and medications reviewed and updated.  Review of Systems  Eyes:  Negative for visual disturbance.  Respiratory:  Negative for chest tightness and shortness of breath.   Cardiovascular:  Negative for chest pain, palpitations and leg swelling.  Musculoskeletal:        Left knee pain   Neurological:  Negative for dizziness, light-headedness and headaches.    Per HPI unless specifically indicated above     Objective:    BP 134/65 (BP Location: Right Arm, Patient Position: Sitting, Cuff Size: Normal)   Pulse 78   Temp 98.2 F (36.8 C) (Oral)   Resp 15   Ht 6' 0.01" (1.829 m)   Wt 224 lb 3.2 oz (101.7 kg)   SpO2 98%   BMI 30.40 kg/m   Wt Readings from Last 3 Encounters:  09/26/23 224 lb 3.2 oz (101.7 kg)  09/13/23 225 lb (102.1 kg)  03/24/23 221 lb 6.4 oz (100.4 kg)    Physical Exam Vitals and nursing note reviewed.  Constitutional:      General: He is not in acute distress.    Appearance: Normal appearance. He is not ill-appearing, toxic-appearing or diaphoretic.  HENT:     Head: Normocephalic.     Right Ear: External ear normal.     Left Ear: External ear normal.     Nose: Nose normal. No congestion or rhinorrhea.     Mouth/Throat:     Mouth: Mucous membranes are moist.  Eyes:     General:        Right eye: No discharge.        Left eye: No discharge.     Extraocular Movements: Extraocular movements intact.     Conjunctiva/sclera: Conjunctivae normal.     Pupils: Pupils  are equal, round, and reactive to light.  Cardiovascular:     Rate and Rhythm: Normal rate and regular rhythm.     Heart sounds: No murmur heard. Pulmonary:     Effort: Pulmonary effort is normal. No respiratory distress.     Breath sounds: Normal breath sounds. No wheezing, rhonchi or rales.  Abdominal:     General: Abdomen is flat. Bowel sounds are normal.  Musculoskeletal:     Cervical back: Normal range of motion and neck supple.  Skin:    General: Skin is warm and dry.     Capillary Refill: Capillary refill takes less than 2 seconds.  Neurological:     General: No focal deficit present.     Mental Status: He is alert and oriented to person, place, and time.  Psychiatric:        Mood and Affect: Mood normal.        Behavior: Behavior normal.        Thought Content:  Thought content normal.        Judgment: Judgment normal.     Results for orders placed or performed in visit on 09/12/23  PSA   Collection Time: 09/12/23 10:49 AM  Result Value Ref Range   Prostate Specific Ag, Serum 16.8 (H) 0.0 - 4.0 ng/mL      Assessment & Plan:   Problem List Items Addressed This Visit       Cardiovascular and Mediastinum   Essential hypertension - Primary     Other   Hypercholesteremia   Prediabetes     Follow up plan: No follow-ups on file.

## 2023-09-27 ENCOUNTER — Encounter: Payer: Self-pay | Admitting: Nurse Practitioner

## 2023-09-27 LAB — COMPREHENSIVE METABOLIC PANEL WITH GFR
ALT: 28 IU/L (ref 0–44)
AST: 33 IU/L (ref 0–40)
Albumin: 4.8 g/dL (ref 3.9–4.9)
Alkaline Phosphatase: 142 IU/L — ABNORMAL HIGH (ref 44–121)
BUN/Creatinine Ratio: 17 (ref 10–24)
BUN: 14 mg/dL (ref 8–27)
Bilirubin Total: 0.7 mg/dL (ref 0.0–1.2)
CO2: 23 mmol/L (ref 20–29)
Calcium: 9.4 mg/dL (ref 8.6–10.2)
Chloride: 98 mmol/L (ref 96–106)
Creatinine, Ser: 0.81 mg/dL (ref 0.76–1.27)
Globulin, Total: 2.4 g/dL (ref 1.5–4.5)
Glucose: 122 mg/dL — ABNORMAL HIGH (ref 70–99)
Potassium: 3.9 mmol/L (ref 3.5–5.2)
Sodium: 140 mmol/L (ref 134–144)
Total Protein: 7.2 g/dL (ref 6.0–8.5)
eGFR: 98 mL/min/{1.73_m2} (ref >59)

## 2023-09-27 LAB — LIPID PANEL
Chol/HDL Ratio: 3.6 ratio (ref 0.0–5.0)
Cholesterol, Total: 129 mg/dL (ref 100–199)
HDL: 36 mg/dL — ABNORMAL LOW (ref >39)
LDL Chol Calc (NIH): 57 mg/dL (ref 0–99)
Triglycerides: 225 mg/dL — ABNORMAL HIGH (ref 0–149)
VLDL Cholesterol Cal: 36 mg/dL (ref 5–40)

## 2023-09-27 LAB — HEMOGLOBIN A1C
Est. average glucose Bld gHb Est-mCnc: 140 mg/dL
Hgb A1c MFr Bld: 6.5 % — ABNORMAL HIGH (ref 4.8–5.6)

## 2023-11-01 ENCOUNTER — Other Ambulatory Visit: Payer: Self-pay | Admitting: Urology

## 2023-11-01 ENCOUNTER — Other Ambulatory Visit: Payer: Self-pay | Admitting: Nurse Practitioner

## 2023-11-01 DIAGNOSIS — N401 Enlarged prostate with lower urinary tract symptoms: Secondary | ICD-10-CM

## 2023-11-01 DIAGNOSIS — I10 Essential (primary) hypertension: Secondary | ICD-10-CM

## 2023-11-02 NOTE — Telephone Encounter (Signed)
 Requested Prescriptions  Refused Prescriptions Disp Refills   triamterene -hydrochlorothiazide (MAXZIDE-25) 37.5-25 MG tablet [Pharmacy Med Name: Triamterene -HCTZ 37.5-25 MG Oral Tablet] 90 tablet 3    Sig: TAKE 1 TABLET BY MOUTH DAILY     Cardiovascular: Diuretic Combos Passed - 11/02/2023  4:37 PM      Passed - K in normal range and within 180 days    Potassium  Date Value Ref Range Status  09/26/2023 3.9 3.5 - 5.2 mmol/L Final         Passed - Na in normal range and within 180 days    Sodium  Date Value Ref Range Status  09/26/2023 140 134 - 144 mmol/L Final         Passed - Cr in normal range and within 180 days    Creatinine, Ser  Date Value Ref Range Status  09/26/2023 0.81 0.76 - 1.27 mg/dL Final         Passed - Last BP in normal range    BP Readings from Last 1 Encounters:  09/26/23 134/65         Passed - Valid encounter within last 6 months    Recent Outpatient Visits           1 month ago Essential hypertension   [facility] Aileen Alexanders, NP       Future Appointments             In 10 months Stoioff, Kizzie Perks, MD [facility] [place]             amLODipine  (NORVASC ) 10 MG tablet [Pharmacy Med Name: amLODIPine  Besylate 10 MG Oral Tablet] 90 tablet 3    Sig: TAKE 1 TABLET BY MOUTH DAILY     Cardiovascular: Calcium  Channel Blockers 2 Passed - 11/02/2023  4:37 PM      Passed - Last BP in normal range    BP Readings from Last 1 Encounters:  09/26/23 134/65         Passed - Last Heart Rate in normal range    Pulse Readings from Last 1 Encounters:  09/26/23 78         Passed - Valid encounter within last 6 months    Recent Outpatient Visits           1 month ago Essential hypertension   [facility] Aileen Alexanders, NP       Future Appointments             In 10 months Stoioff, Kizzie Perks, MD [facility] [place]

## 2023-12-08 ENCOUNTER — Other Ambulatory Visit: Payer: Self-pay | Admitting: Nurse Practitioner

## 2023-12-08 DIAGNOSIS — I10 Essential (primary) hypertension: Secondary | ICD-10-CM

## 2023-12-09 NOTE — Telephone Encounter (Signed)
 Reordered 09/26/23 #90 1 RF  Requested Prescriptions  Refused Prescriptions Disp Refills   amLODipine  (NORVASC ) 10 MG tablet [Pharmacy Med Name: amLODIPine  Besylate 10 MG Oral Tablet] 90 tablet 3    Sig: TAKE 1 TABLET BY MOUTH DAILY     Cardiovascular: Calcium  Channel Blockers 2 Passed - 12/09/2023  3:35 PM      Passed - Last BP in normal range    BP Readings from Last 1 Encounters:  09/26/23 134/65         Passed - Last Heart Rate in normal range    Pulse Readings from Last 1 Encounters:  09/26/23 78         Passed - Valid encounter within last 6 months    Recent Outpatient Visits           2 months ago Essential hypertension   [facility] Melvin Pao, NP       Future Appointments             In 9 months Stoioff, Glendia BROCKS, MD [facility] [place]

## 2023-12-18 ENCOUNTER — Other Ambulatory Visit: Payer: Self-pay | Admitting: Nurse Practitioner

## 2023-12-18 DIAGNOSIS — I10 Essential (primary) hypertension: Secondary | ICD-10-CM

## 2023-12-21 NOTE — Telephone Encounter (Signed)
 Change to mail order pharmacy. Filled with remainder of refill lefts.  Requested Prescriptions  Pending Prescriptions Disp Refills   amLODipine  (NORVASC ) 10 MG tablet [Pharmacy Med Name: amLODIPine  Besylate 10 MG Oral Tablet] 90 tablet 0    Sig: TAKE 1 TABLET BY MOUTH DAILY     Cardiovascular: Calcium  Channel Blockers 2 Passed - 12/21/2023  8:00 AM      Passed - Last BP in normal range    BP Readings from Last 1 Encounters:  09/26/23 134/65         Passed - Last Heart Rate in normal range    Pulse Readings from Last 1 Encounters:  09/26/23 78         Passed - Valid encounter within last 6 months    Recent Outpatient Visits           2 months ago Essential hypertension   [facility] Melvin Pao, NP       Future Appointments             In 8 months Stoioff, Glendia BROCKS, MD [facility] [place]             triamterene -hydrochlorothiazide (MAXZIDE-25) 37.5-25 MG tablet [Pharmacy Med Name: Triamterene -HCTZ 37.5-25 MG Oral Tablet] 90 tablet 0    Sig: TAKE 1 TABLET BY MOUTH DAILY     Cardiovascular: Diuretic Combos Passed - 12/21/2023  8:00 AM      Passed - K in normal range and within 180 days    Potassium  Date Value Ref Range Status  09/26/2023 3.9 3.5 - 5.2 mmol/L Final         Passed - Na in normal range and within 180 days    Sodium  Date Value Ref Range Status  09/26/2023 140 134 - 144 mmol/L Final         Passed - Cr in normal range and within 180 days    Creatinine, Ser  Date Value Ref Range Status  09/26/2023 0.81 0.76 - 1.27 mg/dL Final         Passed - Last BP in normal range    BP Readings from Last 1 Encounters:  09/26/23 134/65         Passed - Valid encounter within last 6 months    Recent Outpatient Visits           2 months ago Essential hypertension   [facility] Melvin Pao, NP       Future Appointments             In 8 months Stoioff, Glendia BROCKS, MD [facility]  Urology 

## 2024-02-26 ENCOUNTER — Other Ambulatory Visit: Payer: Self-pay | Admitting: Nurse Practitioner

## 2024-02-26 DIAGNOSIS — I10 Essential (primary) hypertension: Secondary | ICD-10-CM

## 2024-02-27 NOTE — Telephone Encounter (Signed)
 Requested Prescriptions  Pending Prescriptions Disp Refills   triamterene -hydrochlorothiazide (MAXZIDE-25) 37.5-25 MG tablet [Pharmacy Med Name: Triamterene -HCTZ 37.5-25 MG Oral Tablet] 90 tablet 0    Sig: TAKE 1 TABLET BY MOUTH DAILY     Cardiovascular: Diuretic Combos Passed - 02/27/2024  4:19 PM      Passed - K in normal range and within 180 days    Potassium  Date Value Ref Range Status  09/26/2023 3.9 3.5 - 5.2 mmol/L Final         Passed - Na in normal range and within 180 days    Sodium  Date Value Ref Range Status  09/26/2023 140 134 - 144 mmol/L Final         Passed - Cr in normal range and within 180 days    Creatinine, Ser  Date Value Ref Range Status  09/26/2023 0.81 0.76 - 1.27 mg/dL Final         Passed - Last BP in normal range    BP Readings from Last 1 Encounters:  09/26/23 134/65         Passed - Valid encounter within last 6 months    Recent Outpatient Visits           5 months ago Essential hypertension   [facility] Melvin Pao, NP       Future Appointments             In 6 months Stoioff, Glendia BROCKS, MD [facility] Urology [place]

## 2024-03-07 ENCOUNTER — Other Ambulatory Visit: Payer: Self-pay | Admitting: Nurse Practitioner

## 2024-03-07 DIAGNOSIS — I10 Essential (primary) hypertension: Secondary | ICD-10-CM

## 2024-03-08 NOTE — Telephone Encounter (Signed)
 Requested Prescriptions  Pending Prescriptions Disp Refills   amLODipine  (NORVASC ) 10 MG tablet [Pharmacy Med Name: amLODIPine  Besylate 10 MG Oral Tablet] 90 tablet 0    Sig: TAKE 1 TABLET BY MOUTH DAILY     Cardiovascular: Calcium  Channel Blockers 2 Passed - 03/08/2024  3:35 PM      Passed - Last BP in normal range    BP Readings from Last 1 Encounters:  09/26/23 134/65         Passed - Last Heart Rate in normal range    Pulse Readings from Last 1 Encounters:  09/26/23 78         Passed - Valid encounter within last 6 months    Recent Outpatient Visits           5 months ago Essential hypertension   [facility] Melvin Pao, NP       Future Appointments             In 6 months Stoioff, Glendia BROCKS, MD [facility] Urology [place]

## 2024-03-14 ENCOUNTER — Other Ambulatory Visit

## 2024-03-14 DIAGNOSIS — R972 Elevated prostate specific antigen [PSA]: Secondary | ICD-10-CM

## 2024-03-15 ENCOUNTER — Ambulatory Visit: Payer: Self-pay | Admitting: Urology

## 2024-03-15 LAB — PSA: Prostate Specific Ag, Serum: 16.4 ng/mL — ABNORMAL HIGH (ref 0.0–4.0)

## 2024-04-01 ENCOUNTER — Ambulatory Visit: Admitting: Nurse Practitioner

## 2024-04-01 ENCOUNTER — Encounter: Payer: Self-pay | Admitting: Nurse Practitioner

## 2024-04-01 VITALS — BP 134/68 | HR 69 | Temp 98.4°F | Ht 72.1 in | Wt 217.4 lb

## 2024-04-01 DIAGNOSIS — R7303 Prediabetes: Secondary | ICD-10-CM | POA: Diagnosis not present

## 2024-04-01 DIAGNOSIS — Z0000 Encounter for general adult medical examination without abnormal findings: Secondary | ICD-10-CM

## 2024-04-01 DIAGNOSIS — Z23 Encounter for immunization: Secondary | ICD-10-CM

## 2024-04-01 DIAGNOSIS — I10 Essential (primary) hypertension: Secondary | ICD-10-CM

## 2024-04-01 DIAGNOSIS — Z1211 Encounter for screening for malignant neoplasm of colon: Secondary | ICD-10-CM

## 2024-04-01 DIAGNOSIS — E7800 Pure hypercholesterolemia, unspecified: Secondary | ICD-10-CM | POA: Diagnosis not present

## 2024-04-01 NOTE — Progress Notes (Signed)
 BP 134/68 (BP Location: Right Arm, Cuff Size: Normal)   Pulse 69   Temp 98.4 F (36.9 C) (Oral)   Ht 6' 0.1 (1.831 m)   Wt 217 lb 6.4 oz (98.6 kg)   SpO2 98%   BMI 29.40 kg/m    Subjective:    Patient ID: Chad Matthews, male    DOB: Jul 22, 1960, 63 y.o.   MRN: [id number]  NOTE WRITTEN BY DNP STUDENT.  ASSESSMENT AND PLAN OF CARE REVIEWED WITH STUDENT, AGREE WITH ABOVE FINDINGS AND PLAN.  HPI: Chad Matthews is a 63 y.o. male presenting on 04/01/2024 for comprehensive medical examination. Current medical complaints include paresthesias in his calves for the last 30 days. States it is mild bilateral intermittent tingling, denies numbness,pain, weakness or falls, or LE edema. States it is worse with prolonged sitting, but not necessarily worse at night. Hx of arthritis in his lumbar spine that he has had to have steroid injections for in the past, last one 1 year ago. Pt to get pneumonia vaccine today and due for AAA screening.   He currently lives with: spouse Interim Problems from his last visit: no  Depression Screen done today and results listed below:     04/01/2024    8:11 AM 09/26/2023   10:12 AM 08/18/2022    8:08 AM 07/18/2022    8:31 AM 04/15/2022    8:53 AM  Depression screen PHQ 2/9  Decreased Interest 0 0 0 0 0  Down, Depressed, Hopeless 0 0 0 0 0  PHQ - 2 Score 0 0 0 0 0  Altered sleeping 0 0 0 0 0  Tired, decreased energy 0 0 0 0 0  Change in appetite 0 0 0 0 0  Feeling bad or failure about yourself  0 0 0 0 0  Trouble concentrating 0 0 0 0 0  Moving slowly or fidgety/restless 0 0 0 0 0  Suicidal thoughts 0 0 0 0 0  PHQ-9 Score 0 0 0 0 0  Difficult doing work/chores Not difficult at all Not difficult at all Not difficult at all Not difficult at all Not difficult at all    The patient does not have a history of falls. I did complete a risk assessment for falls. A plan of care for falls was documented.   Past Medical History:  Past Medical History:   Diagnosis Date   Arthritis    Cataract    GERD (gastroesophageal reflux disease)    Hernia, hiatal    Hypertension     Surgical History:  Past Surgical History:  Procedure Laterality Date   bicep surgery Right    CATARACT EXTRACTION  08/2020   COLONOSCOPY WITH PROPOFOL  N/A 08/27/2015   Procedure: COLONOSCOPY WITH PROPOFOL ;  Surgeon: Lamar ONEIDA Holmes, MD;  Location[facility] ENDOSCOPY;  Service: Endoscopy;  Laterality: N/A;   ELBOW SURGERY Left 1979   HERNIA REPAIR  1969   TOTAL HIP ARTHROPLASTY Right 08/08/2014   Procedure: RIGHT TOTAL HIP ARTHROPLASTY;  Surgeon: Toribio Silos, MD;  Location: [facility] OR;  Service: Orthopedics;  Laterality: Right;    Medications:  Current Outpatient Medications on File Prior to Visit  Medication Sig   amLODipine  (NORVASC ) 10 MG tablet TAKE 1 TABLET BY MOUTH DAILY   atorvastatin  (LIPITOR) 20 MG tablet Take 1 tablet (20 mg total) by mouth daily.   famotidine  (PEPCID ) 20 MG tablet Take 20 mg by mouth at bedtime.    Multiple Vitamins-Minerals (CENTRUM SILVER 50+MEN PO) Take by  mouth daily.   tamsulosin  (FLOMAX ) 0.4 MG CAPS capsule TAKE 1 CAPSULE BY MOUTH DAILY   triamterene -hydrochlorothiazide (MAXZIDE-25) 37.5-25 MG tablet TAKE 1 TABLET BY MOUTH DAILY   No current facility-administered medications on file prior to visit.    Allergies:  Allergies  Allergen Reactions   Bee Venom Anaphylaxis   Lisinopril Cough    Social History:  Social History   Socioeconomic History   Marital status: Married    Spouse name: Adrien   Number of children: Not on file   Years of education: College   Highest education level: Not on file  Occupational History   Occupation: Retail Banker    Comment: Full-Time  Tobacco Use   Smoking status: Former    Current packs/day: 0.00    Average packs/day: 0.5 packs/day for 10.0 years (5.0 ttl pk-yrs)    Types: Cigarettes    Start date: 07/25/1974    Quit date: 07/25/1984    Years since quitting: 39.7   Smokeless  tobacco: Never  Vaping Use   Vaping status: Never Used  Substance and Sexual Activity   Alcohol use: Yes    Alcohol/week: 14.0 standard drinks of alcohol    Types: 14 Cans of beer per week    Comment: daily   Drug use: No   Sexual activity: Yes    Birth control/protection: None  Other Topics Concern   Not on file  Social History Narrative   Not on file   Social Drivers of Health   Financial Resource Strain: Not on file  Food Insecurity: Not on file  Transportation Needs: Not on file  Physical Activity: Not on file  Stress: Not on file  Social Connections: Not on file  Intimate Partner Violence: Not on file   Social History   Tobacco Use  Smoking Status Former   Current packs/day: 0.00   Average packs/day: 0.5 packs/day for 10.0 years (5.0 ttl pk-yrs)   Types: Cigarettes   Start date: 07/25/1974   Quit date: 07/25/1984   Years since quitting: 39.7  Smokeless Tobacco Never   Social History   Substance and Sexual Activity  Alcohol Use Yes   Alcohol/week: 14.0 standard drinks of alcohol   Types: 14 Cans of beer per week   Comment: daily    Family History:  Family History  Problem Relation Age of Onset   Healthy Mother    Cancer Father    Stroke Father    Transient ischemic attack Father    Lung cancer Father    Healthy Brother    Healthy Brother    Alzheimer's disease Maternal Grandmother    Osteoporosis Paternal Grandmother    Bladder Cancer Neg Hx    Kidney cancer Neg Hx    Prostate cancer Neg Hx     Past medical history, surgical history, medications, allergies, family history and social history reviewed with patient today and changes made to appropriate areas of the chart.   Review of Systems  Eyes:  Negative for blurred vision and double vision.  Respiratory:  Negative for shortness of breath.   Cardiovascular:  Negative for chest pain, palpitations and leg swelling.  Musculoskeletal:  Negative for joint pain and myalgias.       Hip pain   Neurological:  Positive for tingling. Negative for dizziness, weakness and headaches.       Lower extremeties   All other ROS negative except what is listed above and in the HPI.      Objective:    BP  134/68 (BP Location: Right Arm, Cuff Size: Normal)   Pulse 69   Temp 98.4 F (36.9 C) (Oral)   Ht 6' 0.1 (1.831 m)   Wt 217 lb 6.4 oz (98.6 kg)   SpO2 98%   BMI 29.40 kg/m   Wt Readings from Last 3 Encounters:  04/01/24 217 lb 6.4 oz (98.6 kg)  09/26/23 224 lb 3.2 oz (101.7 kg)  09/13/23 225 lb (102.1 kg)    Physical Exam Vitals and nursing note reviewed.  Constitutional:      General: He is not in acute distress.    Appearance: Normal appearance. He is obese. He is not ill-appearing, toxic-appearing or diaphoretic.  HENT:     Head: Normocephalic.     Right Ear: Tympanic membrane, ear canal and external ear normal.     Left Ear: Tympanic membrane, ear canal and external ear normal.     Nose: Nose normal. No congestion or rhinorrhea.     Mouth/Throat:     Mouth: Mucous membranes are moist.  Eyes:     General:        Right eye: No discharge.        Left eye: No discharge.     Extraocular Movements: Extraocular movements intact.     Conjunctiva/sclera: Conjunctivae normal.     Pupils: Pupils are equal, round, and reactive to light.  Cardiovascular:     Rate and Rhythm: Normal rate and regular rhythm.     Heart sounds: No murmur heard. Pulmonary:     Effort: Pulmonary effort is normal. No respiratory distress.     Breath sounds: Normal breath sounds. No wheezing, rhonchi or rales.  Abdominal:     General: Abdomen is flat. Bowel sounds are normal. There is no distension.     Palpations: Abdomen is soft.     Tenderness: There is no abdominal tenderness. There is no guarding.  Musculoskeletal:        General: No signs of injury.     Cervical back: Normal range of motion and neck supple.     Right lower leg: No edema.     Left lower leg: No edema.  Skin:    General:  Skin is warm and dry.     Capillary Refill: Capillary refill takes less than 2 seconds.  Neurological:     General: No focal deficit present.     Mental Status: He is alert and oriented to person, place, and time.     Cranial Nerves: No cranial nerve deficit.     Motor: No weakness.     Deep Tendon Reflexes: Reflexes normal.  Psychiatric:        Mood and Affect: Mood normal.        Behavior: Behavior normal.        Thought Content: Thought content normal.        Judgment: Judgment normal.     Results for orders placed or performed in visit on 03/14/24  PSA   Collection Time: 03/14/24  9:29 AM  Result Value Ref Range   Prostate Specific Ag, Serum 16.4 (H) 0.0 - 4.0 ng/mL      Assessment & Plan:   Problem List Items Addressed This Visit       Cardiovascular and Mediastinum   Essential hypertension   Chronic.  Controlled.  Continue with current medication regimen of Amlodipine  and Maxide.  Refills sent today.   Return to clinic in 6 months for reevaluation.  Call sooner if concerns arise.  Other   Hypercholesteremia   Chronic.  Controlled.  Continue with current medication regimen of Atorvastatin  daily.  Refills sent today.  Labs ordered today.  Return to clinic in 6 months for reevaluation.  Call sooner if concerns arise.   The ASCVD Risk score (Arnett DK, et al., 2019) failed to calculate for the following reasons:   The valid total cholesterol range is 130 to 320 mg/dL      Relevant Orders   Lipid panel   Prediabetes   Labs ordered at visit today.  Will make recommendations based on lab results.        Relevant Orders   Hemoglobin A1c   Other Visit Diagnoses       Annual physical exam    -  Primary   Health maintenance reviewed during visit today.  Labs ordered.  Vaccines reviewed.  Colonoscopy up to date.   Relevant Orders   TSH   Lipid panel   CBC with Differential/Platelet   Comprehensive metabolic panel with GFR   Hemoglobin A1c     Screening  for colon cancer       Relevant Orders   Ambulatory referral to Gastroenterology     Need for vaccination against Streptococcus pneumoniae       Relevant Orders   Pneumococcal conjugate vaccine 20-valent (Prevnar 20) (Completed)        Discussed aspirin prophylaxis for myocardial infarction prevention and decision was it was not indicated  LABORATORY TESTING:  Health maintenance labs ordered today as discussed above.   The natural history of prostate cancer and ongoing controversy regarding screening and potential treatment outcomes of prostate cancer has been discussed with the patient. The meaning of a false positive PSA and a false negative PSA has been discussed. He indicates understanding of the limitations of this screening test and wishes to proceed with screening PSA testing.   IMMUNIZATIONS:   - Tdap: Tetanus vaccination status reviewed: last tetanus booster within 10 years. - Influenza: up to date - Pneumovax: Not applicable - Prevnar: Not applicable - COVID: up to date - HPV: Not applicable - Shingrix  vaccine: Up to date  SCREENING: - Colonoscopy: Up to date  Discussed with patient purpose of the colonoscopy is to detect colon cancer at curable precancerous or early stages   - AAA Screening: Not applicable  -Hearing Test: Not applicable  -Spirometry: Not applicable   PATIENT COUNSELING:    Sexuality: Discussed sexually transmitted diseases, partner selection, use of condoms, avoidance of unintended pregnancy  and contraceptive alternatives.   Advised to avoid cigarette smoking.  I discussed with the patient that most people either abstain from alcohol or drink within safe limits (<=14/week and <=4 drinks/occasion for males, <=7/weeks and <= 3 drinks/occasion for females) and that the risk for alcohol disorders and other health effects rises proportionally with the number of drinks per week and how often a drinker exceeds daily limits.  Discussed cessation/primary  prevention of drug use and availability of treatment for abuse.   Diet: Encouraged to adjust caloric intake to maintain  or achieve ideal body weight, to reduce intake of dietary saturated fat and total fat, to limit sodium intake by avoiding high sodium foods and not adding table salt, and to maintain adequate dietary potassium and calcium  preferably from fresh fruits, vegetables, and low-fat dairy products.    stressed the importance of regular exercise  Injury prevention: Discussed safety belts, safety helmets, smoke detector, smoking near bedding or upholstery.   Dental health: Discussed importance  of regular tooth brushing, flossing, and dental visits.   Follow up plan: NEXT PREVENTATIVE PHYSICAL DUE IN 1 YEAR. Return in about 1 month (around 05/01/2024) for Welcome to Medicare (40 min visit).

## 2024-04-01 NOTE — Assessment & Plan Note (Signed)
 Labs ordered at visit today.  Will make recommendations based on lab results.

## 2024-04-01 NOTE — Assessment & Plan Note (Signed)
 Chronic.  Controlled.  Continue with current medication regimen of Atorvastatin  daily.  Refills sent today.  Labs ordered today.  Return to clinic in 6 months for reevaluation.  Call sooner if concerns arise.   The ASCVD Risk score (Arnett DK, et al., 2019) failed to calculate for the following reasons:   The valid total cholesterol range is 130 to 320 mg/dL

## 2024-04-01 NOTE — Assessment & Plan Note (Signed)
 Chronic.  Controlled.  Continue with current medication regimen of Amlodipine  and Maxide.  Refills sent today.   Return to clinic in 6 months for reevaluation.  Call sooner if concerns arise.

## 2024-04-02 ENCOUNTER — Ambulatory Visit: Payer: Self-pay | Admitting: Nurse Practitioner

## 2024-04-02 LAB — HEMOGLOBIN A1C
Est. average glucose Bld gHb Est-mCnc: 123 mg/dL
Hgb A1c MFr Bld: 5.9 % — ABNORMAL HIGH (ref 4.8–5.6)

## 2024-04-02 LAB — CBC WITH DIFFERENTIAL/PLATELET
Basophils Absolute: 0.1 x10E3/uL (ref 0.0–0.2)
Basos: 1 %
EOS (ABSOLUTE): 0.2 x10E3/uL (ref 0.0–0.4)
Eos: 4 %
Hematocrit: 43.8 % (ref 37.5–51.0)
Hemoglobin: 14.4 g/dL (ref 13.0–17.7)
Immature Grans (Abs): 0 x10E3/uL (ref 0.0–0.1)
Immature Granulocytes: 0 %
Lymphocytes Absolute: 0.9 x10E3/uL (ref 0.7–3.1)
Lymphs: 21 %
MCH: 32.7 pg (ref 26.6–33.0)
MCHC: 32.9 g/dL (ref 31.5–35.7)
MCV: 100 fL — ABNORMAL HIGH (ref 79–97)
Monocytes Absolute: 0.3 x10E3/uL (ref 0.1–0.9)
Monocytes: 7 %
Neutrophils Absolute: 3.1 x10E3/uL (ref 1.4–7.0)
Neutrophils: 67 %
Platelets: 99 x10E3/uL — CL (ref 150–450)
RBC: 4.4 x10E6/uL (ref 4.14–5.80)
RDW: 12 % (ref 11.6–15.4)
WBC: 4.6 x10E3/uL (ref 3.4–10.8)

## 2024-04-02 LAB — COMPREHENSIVE METABOLIC PANEL WITH GFR
ALT: 25 IU/L (ref 0–44)
AST: 28 IU/L (ref 0–40)
Albumin: 4.7 g/dL (ref 3.9–4.9)
Alkaline Phosphatase: 122 IU/L (ref 47–123)
BUN/Creatinine Ratio: 15 (ref 10–24)
BUN: 13 mg/dL (ref 8–27)
Bilirubin Total: 0.5 mg/dL (ref 0.0–1.2)
CO2: 23 mmol/L (ref 20–29)
Calcium: 9.7 mg/dL (ref 8.6–10.2)
Chloride: 104 mmol/L (ref 96–106)
Creatinine, Ser: 0.87 mg/dL (ref 0.76–1.27)
Globulin, Total: 2.5 g/dL (ref 1.5–4.5)
Glucose: 108 mg/dL — ABNORMAL HIGH (ref 70–99)
Potassium: 4.1 mmol/L (ref 3.5–5.2)
Sodium: 142 mmol/L (ref 134–144)
Total Protein: 7.2 g/dL (ref 6.0–8.5)
eGFR: 96 mL/min/1.73 (ref >59)

## 2024-04-02 LAB — LIPID PANEL
Chol/HDL Ratio: 3.6 ratio (ref 0.0–5.0)
Cholesterol, Total: 146 mg/dL (ref 100–199)
HDL: 41 mg/dL (ref >39)
LDL Chol Calc (NIH): 79 mg/dL (ref 0–99)
Triglycerides: 148 mg/dL (ref 0–149)
VLDL Cholesterol Cal: 26 mg/dL (ref 5–40)

## 2024-04-02 LAB — TSH: TSH: 0.752 u[IU]/mL (ref 0.450–4.500)

## 2024-04-09 ENCOUNTER — Telehealth: Payer: Self-pay

## 2024-04-09 DIAGNOSIS — Z860100 Personal history of colon polyps, unspecified: Secondary | ICD-10-CM

## 2024-04-09 MED ORDER — NA SULFATE-K SULFATE-MG SULF 17.5-3.13-1.6 GM/177ML PO SOLN: 1.0000 | Freq: Once | ORAL | 0 refills | Status: AC

## 2024-04-09 NOTE — Telephone Encounter (Signed)
 Gastroenterology Pre-Procedure Review  Request Date: 02/24/226 Requesting Physician: Dr. Melany  PATIENT REVIEW QUESTIONS: The patient responded to the following health history questions as indicated:    1. Are you having any GI issues? no 2. Do you have a personal history of Polyps? yes (last colonoscopy performed 02/15/2019 [facility] recommended repeat in 5 years) 3. Do you have a family history of Colon Cancer or Polyps? no 4. Diabetes Mellitus? no 5. Joint replacements in the past 12 months?no 6. Major health problems in the past 3 months?no 7. Any artificial heart valves, MVP, or defibrillator?no    MEDICATIONS & ALLERGIES:    Patient reports the following regarding taking any anticoagulation/antiplatelet therapy:   Plavix, Coumadin, Eliquis, Xarelto, Lovenox , Pradaxa, Brilinta, or Effient? no Aspirin? no  Patient confirms/reports the following medications:  Current Outpatient Medications  Medication Sig Dispense Refill   amLODipine  (NORVASC ) 10 MG tablet TAKE 1 TABLET BY MOUTH DAILY 90 tablet 0   atorvastatin  (LIPITOR) 20 MG tablet Take 1 tablet (20 mg total) by mouth daily. 90 tablet 1   famotidine  (PEPCID ) 20 MG tablet Take 20 mg by mouth at bedtime.      Multiple Vitamins-Minerals (CENTRUM SILVER 50+MEN PO) Take by mouth daily.     tamsulosin  (FLOMAX ) 0.4 MG CAPS capsule TAKE 1 CAPSULE BY MOUTH DAILY 90 capsule 3   triamterene -hydrochlorothiazide (MAXZIDE-25) 37.5-25 MG tablet TAKE 1 TABLET BY MOUTH DAILY 90 tablet 0   No current facility-administered medications for this visit.    Patient confirms/reports the following allergies:  Allergies  Allergen Reactions   Bee Venom Anaphylaxis   Lisinopril Cough    No orders of the defined types were placed in this encounter.   AUTHORIZATION INFORMATION Primary Insurance: 1D#: Group #:  Secondary Insurance: 1D#: Group #:  SCHEDULE INFORMATION: Date: 07/23/24 Time: Location: [facility]

## 2024-05-02 ENCOUNTER — Ambulatory Visit (INDEPENDENT_AMBULATORY_CARE_PROVIDER_SITE_OTHER): Payer: PRIVATE HEALTH INSURANCE | Admitting: Nurse Practitioner

## 2024-05-02 ENCOUNTER — Encounter: Payer: Self-pay | Admitting: Nurse Practitioner

## 2024-05-02 VITALS — BP 124/65 | HR 79 | Temp 98.1°F | Ht 72.0 in | Wt 211.8 lb

## 2024-05-02 DIAGNOSIS — Z0000 Encounter for general adult medical examination without abnormal findings: Secondary | ICD-10-CM

## 2024-05-02 NOTE — Progress Notes (Signed)
 Chief Complaint  Patient presents with   Welcome to Medicare     Subjective:   Chad Matthews is a 63 y.o. male who presents for a Welcome to Medicare Exam.   Visit info / Clinical Intake: Medicare Wellness Visit Type:: Welcome to [employer] (IPPE) Persons participating in visit and providing information:: patient Medicare Wellness Visit Mode:: In-person (required for WTM) Interpreter Needed?: No Pre-visit prep was completed: yes AWV questionnaire completed by patient prior to visit?: no Living arrangements:: lives with spouse/significant other Patient's Overall Health Status Rating: good Typical amount of pain: none Does pain affect daily life?: no Are you currently prescribed opioids?: no  Dietary Habits and Nutritional Risks How many meals a day?: 2 Eats fruit and vegetables daily?: yes Most meals are obtained by: preparing own meals In the last 2 weeks, have you had any of the following?: none Diabetic:: no  Functional Status Activities of Daily Living (to include ambulation/medication): Independent Ambulation: Independent Medication Administration: Independent Home Management (perform basic housework or laundry): Independent Manage your own finances?: yes Primary transportation is: driving Concerns about vision?: no *vision screening is required for WTM* Concerns about hearing?: no  Fall Screening Falls in the past year?: 0 Number of falls in past year: 0 Was there an injury with Fall?: 0 Fall Risk Category Calculator: 0 Patient Fall Risk Level: Low Fall Risk  Fall Risk Patient at Risk for Falls Due to: No Fall Risks Fall risk Follow up: Falls evaluation completed  Home and Transportation Safety: All rugs have non-skid backing?: N/A, no rugs All stairs or steps have railings?: yes Grab bars in the bathtub or shower?: yes Have non-skid surface in bathtub or shower?: yes Good home lighting?: yes Regular seat belt use?: yes Hospital stays in the last  year:: no  Cognitive Assessment Difficulty concentrating, remembering, or making decisions? : no Will 6CIT or Mini Cog be Completed: yes What year is it?: 0 points What month is it?: 0 points Give patient an address phrase to remember (5 components): [address] in [place], [place] About what time is it?: 0 points Count backwards from 20 to 1: 0 points Say the months of the year in reverse: 0 points Repeat the address phrase from earlier: 0 points 6 CIT Score: 0 points  Advance Directives (For Healthcare) Does Patient Have a Medical Advance Directive?: Yes Does patient want to make changes to medical advance directive?: No - Patient declined Type of Advance Directive: Healthcare Power of [place]; Living will Copy of Healthcare Power of Attorney in Chart?: No - copy requested Copy of Living Will in Chart?: No - copy requested  Reviewed/Updated  Reviewed/Updated: Reviewed All (Medical, Surgical, Family, Medications, Allergies, Care Teams, Patient Goals)    Allergies (verified) Bee venom and Lisinopril   Current Medications (verified) Outpatient Encounter Medications as of 05/02/2024  Medication Sig   amLODipine  (NORVASC ) 10 MG tablet TAKE 1 TABLET BY MOUTH DAILY   atorvastatin  (LIPITOR) 20 MG tablet Take 1 tablet (20 mg total) by mouth daily.   famotidine  (PEPCID ) 20 MG tablet Take 20 mg by mouth at bedtime.    Multiple Vitamins-Minerals (CENTRUM SILVER 50+MEN PO) Take by mouth daily.   Multiple Vitamins-Minerals (PRESERVISION AREDS 2 PO) Take 2 tablets by mouth daily at 2 PM.   tamsulosin  (FLOMAX ) 0.4 MG CAPS capsule TAKE 1 CAPSULE BY MOUTH DAILY   triamterene -hydrochlorothiazide (MAXZIDE-25) 37.5-25 MG tablet TAKE 1 TABLET BY MOUTH DAILY   No facility-administered encounter medications on file as of 05/02/2024.  History: Past Medical History:  Diagnosis Date   Arthritis    Cataract    GERD (gastroesophageal reflux disease)    Hernia, hiatal    Hypertension    Past  Surgical History:  Procedure Laterality Date   bicep surgery Right    CATARACT EXTRACTION  08/2020   COLONOSCOPY WITH PROPOFOL  N/A 08/27/2015   Procedure: COLONOSCOPY WITH PROPOFOL ;  Surgeon: Lamar ONEIDA Holmes, MD;  Location: [facility] ENDOSCOPY;  Service: Endoscopy;  Laterality: N/A;   ELBOW SURGERY Left 1979   HERNIA REPAIR  1969   TOTAL HIP ARTHROPLASTY Right 08/08/2014   Procedure: RIGHT TOTAL HIP ARTHROPLASTY;  Surgeon: Toribio Silos, MD;  Location: [facility] OR;  Service: Orthopedics;  Laterality: Right;   Family History  Problem Relation Age of Onset   Healthy Mother    Cancer Father    Stroke Father    Transient ischemic attack Father    Lung cancer Father    Healthy Brother    Healthy Brother    Alzheimer's disease Maternal Grandmother    Osteoporosis Paternal Grandmother    Bladder Cancer Neg Hx    Kidney cancer Neg Hx    Prostate cancer Neg Hx    Social History   Occupational History   Occupation: Retail Banker    Comment: Full-Time  Tobacco Use   Smoking status: Former    Current packs/day: 0.00    Average packs/day: 0.5 packs/day for 10.0 years (5.0 ttl pk-yrs)    Types: Cigarettes    Start date: 07/25/1974    Quit date: 07/25/1984    Years since quitting: 39.7   Smokeless tobacco: Never  Vaping Use   Vaping status: Never Used  Substance and Sexual Activity   Alcohol use: Yes    Alcohol/week: 14.0 standard drinks of alcohol    Types: 14 Cans of beer per week    Comment: daily   Drug use: No   Sexual activity: Yes    Birth control/protection: None   Tobacco Counseling Counseling given: Not Answered  SDOH Screenings   Food Insecurity: No Food Insecurity (05/02/2024)  Housing: Low Risk  (05/02/2024)  Transportation Needs: No Transportation Needs (05/02/2024)  Utilities: Not At Risk (05/02/2024)  Alcohol Screen: Low Risk  (01/09/2020)  Depression (PHQ2-9): Low Risk  (05/02/2024)  Physical Activity: Insufficiently Active (05/02/2024)  Social Connections:  Moderately Integrated (05/02/2024)  Stress: No Stress Concern Present (05/02/2024)  Tobacco Use: Medium Risk (05/02/2024)  Health Literacy: Adequate Health Literacy (05/02/2024)   See flowsheets for full screening details  Depression Screen PHQ 2 & 9 Depression Scale- Over the past 2 weeks, how often have you been bothered by any of the following problems? Little interest or pleasure in doing things: 0 Feeling down, depressed, or hopeless (PHQ Adolescent also includes...irritable): 0 PHQ-2 Total Score: 0 Trouble falling or staying asleep, or sleeping too much: 0 Feeling tired or having little energy: 0 Poor appetite or overeating (PHQ Adolescent also includes...weight loss): 0 Feeling bad about yourself - or that you are a failure or have let yourself or your family down: 0 Trouble concentrating on things, such as reading the newspaper or watching television (PHQ Adolescent also includes...like school work): 0 Moving or speaking so slowly that other people could have noticed. Or the opposite - being so fidgety or restless that you have been moving around a lot more than usual: 0 Thoughts that you would be better off dead, or of hurting yourself in some way: 0 PHQ-9 Total Score: 0 If you checked  off any problems, how difficult have these problems made it for you to do your work, take care of things at home, or get along with other people?: Not difficult at all      Goals Addressed   None          Objective:    Today's Vitals   05/02/24 1501 05/02/24 1532  BP: (!) 142/83 124/65  Pulse: 96 79  Temp: 98.1 F (36.7 C)   TempSrc: Oral   SpO2: 98%   Weight: 211 lb 12.8 oz (96.1 kg)   Height: 6' (1.829 m)   PainSc: 0-No pain    Body mass index is 28.73 kg/m.      Hearing/Vision screen Hearing Screening   500Hz  1000Hz  2000Hz  4000Hz   Right ear 40 25 40 Fail  Left ear 25 25 40 Fail   Vision Screening   Right eye Left eye Both eyes  Without correction     With correction  20/20 20/20 20/15    Immunizations and Health Maintenance Health Maintenance  Topic Date Due   COVID-19 Vaccine (7 - 2025-26 season) 01/29/2024   Colonoscopy  02/15/2024   Medicare Annual Wellness (AWV)  05/02/2025   DTaP/Tdap/Td (3 - Td or Tdap) 06/01/2026   Pneumococcal Vaccine: 50+ Years  Completed   Influenza Vaccine  Completed   Hepatitis C Screening  Completed   HIV Screening  Completed   Zoster Vaccines- Shingrix   Completed   Hepatitis B Vaccines 19-59 Average Risk  Aged Out   Meningococcal B Vaccine  Aged Out    EKG: normal EKG, normal sinus rhythm, unchanged from previous tracings     Assessment/Plan:  This is a routine wellness examination for [place].  Patient Care Team: Melvin Pao, NP as PCP - General (Nurse Practitioner)  I have personally reviewed and noted the following in the patient's chart:   Medical and social history Use of alcohol, tobacco or illicit drugs  Current medications and supplements including opioid prescriptions. Functional ability and status Nutritional status Physical activity Advanced directives List of other physicians Hospitalizations, surgeries, and ER visits in previous 12 months Vitals Screenings to include cognitive, depression, and falls Referrals and appointments  Orders Placed This Encounter  Procedures   EKG 12-Lead   In addition, I have reviewed and discussed with patient certain preventive protocols, quality metrics, and best practice recommendations. A written personalized care plan for preventive services as well as general preventive health recommendations were provided to patient.   Pao Melvin, NP   05/02/2024   No follow-ups on file.

## 2024-05-12 ENCOUNTER — Other Ambulatory Visit: Payer: Self-pay | Admitting: Nurse Practitioner

## 2024-05-12 DIAGNOSIS — I10 Essential (primary) hypertension: Secondary | ICD-10-CM

## 2024-05-12 DIAGNOSIS — E7800 Pure hypercholesterolemia, unspecified: Secondary | ICD-10-CM

## 2024-05-14 NOTE — Telephone Encounter (Signed)
 Requested Prescriptions  Pending Prescriptions Disp Refills   amLODipine  (NORVASC ) 10 MG tablet [Pharmacy Med Name: amLODIPine  Besylate 10 MG Oral Tablet] 90 tablet 1    Sig: TAKE 1 TABLET BY MOUTH DAILY     Cardiovascular: Calcium  Channel Blockers 2 Passed - 05/14/2024  3:44 PM      Passed - Last BP in normal range    BP Readings from Last 1 Encounters:  05/02/24 124/65         Passed - Last Heart Rate in normal range    Pulse Readings from Last 1 Encounters:  05/02/24 79         Passed - Valid encounter within last 6 months    Recent Outpatient Visits           1 week ago Encounter for [employer] annual wellness exam   [place] [facility] Melvin Pao, NP   1 month ago Annual physical exam   [place] [facility] Melvin Pao, NP   7 months ago Essential hypertension   [place] [facility] Melvin Pao, NP       Future Appointments             In 4 months Stoioff, Glendia BROCKS, MD [facility] [place]             atorvastatin  (LIPITOR) 20 MG tablet [Pharmacy Med Name: Atorvastatin  Calcium  20 MG Oral Tablet] 90 tablet 3    Sig: TAKE 1 TABLET BY MOUTH DAILY     Cardiovascular:  Antilipid - Statins Failed - 05/14/2024  3:44 PM      Failed - Lipid Panel in normal range within the last 12 months    Cholesterol, Total  Date Value Ref Range Status  04/01/2024 146 100 - 199 mg/dL Final   LDL Chol Calc (NIH)  Date Value Ref Range Status  04/01/2024 79 0 - 99 mg/dL Final   HDL  Date Value Ref Range Status  04/01/2024 41 >39 mg/dL Final   Triglycerides  Date Value Ref Range Status  04/01/2024 148 0 - 149 mg/dL Final         Passed - Patient is not pregnant      Passed - Valid encounter within last 12 months    Recent Outpatient Visits           1 week ago Encounter for [employer] annual wellness exam   [place] [facility] Melvin Pao, NP   1 month ago Annual physical  exam   [place] [facility] Melvin Pao, NP   7 months ago Essential hypertension   [place] [facility] Melvin Pao, NP       Future Appointments             In 4 months Stoioff, Glendia BROCKS, MD [facility] [place]             triamterene -hydrochlorothiazide (MAXZIDE-25) 37.5-25 MG tablet [Pharmacy Med Name: Triamterene -HCTZ 37.5-25 MG Oral Tablet] 90 tablet 1    Sig: TAKE 1 TABLET BY MOUTH DAILY     Cardiovascular: Diuretic Combos Passed - 05/14/2024  3:44 PM      Passed - K in normal range and within 180 days    Potassium  Date Value Ref Range Status  04/01/2024 4.1 3.5 - 5.2 mmol/L Final         Passed - Na in normal range and within 180 days    Sodium  Date Value  Ref Range Status  04/01/2024 142 134 - 144 mmol/L Final         Passed - Cr in normal range and within 180 days    Creatinine, Ser  Date Value Ref Range Status  04/01/2024 0.87 0.76 - 1.27 mg/dL Final         Passed - Last BP in normal range    BP Readings from Last 1 Encounters:  05/02/24 124/65         Passed - Valid encounter within last 6 months    Recent Outpatient Visits           1 week ago Encounter for [employer] annual wellness exam   [place] [facility] Melvin Pao, NP   1 month ago Annual physical exam   [place] [facility] Melvin Pao, NP   7 months ago Essential hypertension   [place] [facility] Melvin Pao, NP       Future Appointments             In 4 months Stoioff, Glendia BROCKS, MD [place] [facility]

## 2024-09-12 ENCOUNTER — Other Ambulatory Visit

## 2024-09-13 ENCOUNTER — Ambulatory Visit: Admitting: Urology

## 2024-10-02 ENCOUNTER — Ambulatory Visit: Admitting: Nurse Practitioner
